# Patient Record
Sex: Female | Born: 1935 | Race: Black or African American | Hispanic: No | Marital: Married | Smoking: Never smoker
Health system: Southern US, Community
[De-identification: ages and names within clinical notes are randomized; demographics above are authoritative.]

## PROBLEM LIST (undated history)

## (undated) DIAGNOSIS — I350 Nonrheumatic aortic (valve) stenosis: Secondary | ICD-10-CM

## (undated) DIAGNOSIS — J449 Chronic obstructive pulmonary disease, unspecified: Secondary | ICD-10-CM

## (undated) DIAGNOSIS — F0390 Unspecified dementia without behavioral disturbance: Secondary | ICD-10-CM

## (undated) DIAGNOSIS — I509 Heart failure, unspecified: Secondary | ICD-10-CM

## (undated) DIAGNOSIS — E079 Disorder of thyroid, unspecified: Secondary | ICD-10-CM

## (undated) DIAGNOSIS — E7800 Pure hypercholesterolemia, unspecified: Secondary | ICD-10-CM

## (undated) DIAGNOSIS — I10 Essential (primary) hypertension: Secondary | ICD-10-CM

## (undated) HISTORY — DX: Nonrheumatic aortic (valve) stenosis: I35.0

## (undated) HISTORY — PX: THYROID SURGERY: SHX805

## (undated) HISTORY — DX: Essential (primary) hypertension: I10

## (undated) HISTORY — DX: Unspecified dementia, unspecified severity, without behavioral disturbance, psychotic disturbance, mood disturbance, and anxiety: F03.90

## (undated) HISTORY — DX: Heart failure, unspecified: I50.9

## (undated) HISTORY — PX: APPENDECTOMY: SHX54

## (undated) HISTORY — PX: JOINT REPLACEMENT: SHX530

---

## 2007-09-12 ENCOUNTER — Ambulatory Visit: Payer: Self-pay | Admitting: Physician Assistant

## 2009-02-13 ENCOUNTER — Ambulatory Visit: Payer: Self-pay | Admitting: Unknown Physician Specialty

## 2009-02-15 ENCOUNTER — Ambulatory Visit: Payer: Self-pay | Admitting: Unknown Physician Specialty

## 2010-04-21 ENCOUNTER — Encounter: Payer: Self-pay | Admitting: Internal Medicine

## 2010-04-24 ENCOUNTER — Ambulatory Visit: Payer: Self-pay | Admitting: Internal Medicine

## 2010-04-30 ENCOUNTER — Ambulatory Visit: Payer: Self-pay | Admitting: Obstetrics and Gynecology

## 2010-05-08 ENCOUNTER — Encounter: Payer: Self-pay | Admitting: Internal Medicine

## 2010-06-08 ENCOUNTER — Encounter: Payer: Self-pay | Admitting: Internal Medicine

## 2010-07-09 ENCOUNTER — Encounter: Payer: Self-pay | Admitting: Internal Medicine

## 2011-04-03 ENCOUNTER — Ambulatory Visit: Payer: Self-pay | Admitting: Internal Medicine

## 2011-08-24 ENCOUNTER — Inpatient Hospital Stay: Payer: Self-pay | Admitting: Internal Medicine

## 2011-08-24 LAB — COMPREHENSIVE METABOLIC PANEL
Albumin: 4 g/dL (ref 3.4–5.0)
Alkaline Phosphatase: 72 U/L (ref 50–136)
Anion Gap: 9 (ref 7–16)
BUN: 14 mg/dL (ref 7–18)
Calcium, Total: 8.6 mg/dL (ref 8.5–10.1)
Co2: 29 mmol/L (ref 21–32)
Creatinine: 0.71 mg/dL (ref 0.60–1.30)
EGFR (Non-African Amer.): >60
Glucose: 98 mg/dL (ref 65–99)
Osmolality: 280 (ref 275–301)
SGOT(AST): 21 U/L (ref 15–37)
SGPT (ALT): 16 U/L
Sodium: 140 mmol/L (ref 136–145)
Total Protein: 7.3 g/dL (ref 6.4–8.2)

## 2011-08-24 LAB — TROPONIN I: Troponin-I: 0.07 ng/mL — ABNORMAL HIGH

## 2011-08-24 LAB — CBC
HGB: 12.7 g/dL (ref 12.0–16.0)
MCH: 32.5 pg (ref 26.0–34.0)
MCHC: 33.3 g/dL (ref 32.0–36.0)
MCV: 97 fL (ref 80–100)
Platelet: 203 10*3/uL (ref 150–440)
RBC: 3.92 10*6/uL (ref 3.80–5.20)

## 2011-08-25 LAB — BASIC METABOLIC PANEL
BUN: 15 mg/dL (ref 7–18)
Creatinine: 0.75 mg/dL (ref 0.60–1.30)
EGFR (Non-African Amer.): >60
Glucose: 142 mg/dL — ABNORMAL HIGH (ref 65–99)
Potassium: 4.3 mmol/L (ref 3.5–5.1)
Sodium: 140 mmol/L (ref 136–145)

## 2011-08-25 LAB — CBC WITH DIFFERENTIAL/PLATELET
Basophil #: 0 10*3/uL (ref 0.0–0.1)
Basophil %: 0.1 %
HCT: 37.1 % (ref 35.0–47.0)
HGB: 12.4 g/dL (ref 12.0–16.0)
Lymphocyte #: 0.7 10*3/uL — ABNORMAL LOW (ref 1.0–3.6)
Lymphocyte %: 17.7 %
MCHC: 33.4 g/dL (ref 32.0–36.0)
MCV: 98 fL (ref 80–100)
Monocyte %: 0.3 %
Neutrophil #: 3.2 10*3/uL (ref 1.4–6.5)
RDW: 14.5 % (ref 11.5–14.5)
WBC: 3.9 10*3/uL (ref 3.6–11.0)

## 2011-08-25 LAB — LIPID PANEL
Cholesterol: 177 mg/dL (ref 0–200)
HDL Cholesterol: 88 mg/dL — ABNORMAL HIGH (ref 40–60)
Ldl Cholesterol, Calc: 83 mg/dL (ref 0–100)
Triglycerides: 31 mg/dL (ref 0–200)

## 2011-08-25 LAB — TROPONIN I: Troponin-I: 0.05 ng/mL

## 2013-08-10 ENCOUNTER — Observation Stay: Payer: Self-pay | Admitting: Internal Medicine

## 2013-08-10 LAB — BASIC METABOLIC PANEL
ANION GAP: 5 — AB (ref 7–16)
BUN: 17 mg/dL (ref 7–18)
CALCIUM: 8.7 mg/dL (ref 8.5–10.1)
Chloride: 104 mmol/L (ref 98–107)
Co2: 28 mmol/L (ref 21–32)
Creatinine: 0.89 mg/dL (ref 0.60–1.30)
EGFR (Non-African Amer.): 57 — ABNORMAL LOW
Glucose: 125 mg/dL — ABNORMAL HIGH (ref 65–99)
Osmolality: 277 (ref 275–301)
POTASSIUM: 3.8 mmol/L (ref 3.5–5.1)
Sodium: 137 mmol/L (ref 136–145)

## 2013-08-10 LAB — URINALYSIS, COMPLETE
Bilirubin,UR: NEGATIVE
Glucose,UR: NEGATIVE mg/dL (ref 0–75)
Ketone: NEGATIVE
Nitrite: NEGATIVE
PROTEIN: NEGATIVE
Ph: 5 (ref 4.5–8.0)
Specific Gravity: 1.016 (ref 1.003–1.030)

## 2013-08-10 LAB — HEPATIC FUNCTION PANEL A (ARMC)
ALK PHOS: 86 U/L
AST: 12 U/L — AB (ref 15–37)
Albumin: 3.6 g/dL (ref 3.4–5.0)
Bilirubin, Direct: 0.2 mg/dL (ref 0.00–0.20)
Bilirubin,Total: 0.6 mg/dL (ref 0.2–1.0)
SGPT (ALT): 13 U/L (ref 12–78)
Total Protein: 7.4 g/dL (ref 6.4–8.2)

## 2013-08-10 LAB — CBC
HCT: 37.4 % (ref 35.0–47.0)
HGB: 12.1 g/dL (ref 12.0–16.0)
MCH: 31.3 pg (ref 26.0–34.0)
MCHC: 32.4 g/dL (ref 32.0–36.0)
MCV: 97 fL (ref 80–100)
Platelet: 253 10*3/uL (ref 150–440)
RBC: 3.87 10*6/uL (ref 3.80–5.20)
RDW: 14 % (ref 11.5–14.5)
WBC: 5.2 10*3/uL (ref 3.6–11.0)

## 2013-08-10 LAB — PROTIME-INR
INR: 1
Prothrombin Time: 13.4 secs (ref 11.5–14.7)

## 2013-08-10 LAB — PRO B NATRIURETIC PEPTIDE: B-Type Natriuretic Peptide: 510 pg/mL — ABNORMAL HIGH (ref 0–450)

## 2013-08-10 LAB — APTT: Activated PTT: 34 secs (ref 23.6–35.9)

## 2013-08-10 LAB — MAGNESIUM: Magnesium: 2 mg/dL

## 2013-08-10 LAB — TROPONIN I: Troponin-I: 0.04 ng/mL

## 2013-08-11 LAB — CBC WITH DIFFERENTIAL/PLATELET
BASOS PCT: 0.5 %
Basophil #: 0 10*3/uL (ref 0.0–0.1)
EOS PCT: 2.9 %
Eosinophil #: 0.1 10*3/uL (ref 0.0–0.7)
HCT: 29.2 % — AB (ref 35.0–47.0)
HGB: 9.9 g/dL — ABNORMAL LOW (ref 12.0–16.0)
LYMPHS ABS: 1.1 10*3/uL (ref 1.0–3.6)
Lymphocyte %: 25.1 %
MCH: 32.8 pg (ref 26.0–34.0)
MCHC: 34 g/dL (ref 32.0–36.0)
MCV: 96 fL (ref 80–100)
Monocyte #: 0.4 x10 3/mm (ref 0.2–0.9)
Monocyte %: 10 %
Neutrophil #: 2.6 10*3/uL (ref 1.4–6.5)
Neutrophil %: 61.5 %
Platelet: 196 10*3/uL (ref 150–440)
RBC: 3.02 10*6/uL — ABNORMAL LOW (ref 3.80–5.20)
RDW: 13.7 % (ref 11.5–14.5)
WBC: 4.2 10*3/uL (ref 3.6–11.0)

## 2013-08-11 LAB — IRON AND TIBC
Iron Bind.Cap.(Total): 226 ug/dL — ABNORMAL LOW (ref 250–450)
Iron Saturation: 23 %
Iron: 52 ug/dL (ref 50–170)
UNBOUND IRON-BIND. CAP.: 174 ug/dL

## 2013-08-11 LAB — HEMOGLOBIN
HGB: 10.2 g/dL — AB (ref 12.0–16.0)
HGB: 9.4 g/dL — ABNORMAL LOW (ref 12.0–16.0)

## 2013-08-11 LAB — FERRITIN: FERRITIN (ARMC): 49 ng/mL (ref 8–388)

## 2013-08-12 LAB — HEMOGLOBIN: HGB: 10.6 g/dL — ABNORMAL LOW (ref 12.0–16.0)

## 2013-08-13 LAB — HEMOGLOBIN
HGB: 8.4 g/dL — ABNORMAL LOW (ref 12.0–16.0)
HGB: 9.6 g/dL — ABNORMAL LOW (ref 12.0–16.0)

## 2013-08-14 LAB — URINE CULTURE

## 2013-08-24 DIAGNOSIS — E785 Hyperlipidemia, unspecified: Secondary | ICD-10-CM | POA: Diagnosis present

## 2013-08-24 DIAGNOSIS — E039 Hypothyroidism, unspecified: Secondary | ICD-10-CM | POA: Diagnosis present

## 2014-02-21 ENCOUNTER — Ambulatory Visit: Payer: Self-pay | Admitting: Ophthalmology

## 2014-02-21 DIAGNOSIS — I10 Essential (primary) hypertension: Secondary | ICD-10-CM

## 2014-02-21 DIAGNOSIS — Z01810 Encounter for preprocedural cardiovascular examination: Secondary | ICD-10-CM

## 2014-02-21 LAB — HEMOGLOBIN: HGB: 12.7 g/dL (ref 12.0–16.0)

## 2014-02-21 LAB — POTASSIUM: POTASSIUM: 4.2 mmol/L (ref 3.5–5.1)

## 2014-03-05 ENCOUNTER — Ambulatory Visit: Payer: Self-pay | Admitting: Ophthalmology

## 2014-05-07 ENCOUNTER — Ambulatory Visit: Payer: Self-pay | Admitting: Ophthalmology

## 2014-07-29 ENCOUNTER — Emergency Department: Payer: Self-pay | Admitting: Emergency Medicine

## 2014-09-29 NOTE — Consult Note (Signed)
I have seen and examined Ann [name] and agree with [name]'s a/p.  rectal bleeding has stopped and her Hgb is stable. Likely diverticular.  monitor Hgbif evidence of active bleeding or large drop in Hgb obtain tagged rbc scan. if hgb stable tomorrow and no further bleeding, ok for d/c.   Electronic Signatures: Dow Adolphein, Orlen Leedy (MD)  (Signed on 06-Mar-15 17:55)  Authored  Last Updated: 06-Mar-15 17:55 by Dow Adolphein, Chase Knebel (MD)

## 2014-09-29 NOTE — Discharge Summary (Signed)
PATIENT NAME:  Ann [name], Ann [name] MR#:  [id number] DATE OF BIRTH:  1919/06/28  DATE OF ADMISSION:  08/10/2013 DATE OF DISCHARGE:  08/14/2013  ADMITTING DIAGNOSIS: Gastrointestinal bleed.   DISCHARGE DIAGNOSES:   1.  Lower gastrointestinal bleed, suspected hemorrhoidal.  2.  Acute posthemorrhagic anemia.  3.  Atrial fibrillation, rapid ventricular response. 4.  History of hypertension, hyperlipidemia, hypothyroidism, osteoarthritis, generalized weakness, unsteadiness on the feet.  5.  Neuropathy of unclear etiology at this time.   DISCHARGE CONDITION: Stable.   DISCHARGE MEDICATIONS: The patient is to continue magnesium oxide 400 milligrams by mouth daily, levothyroxine 50 micrograms by mouth daily, potassium gluconate 1 tablet daily, Symbicort 160/4.5 one puff twice daily, vitamin B12 one tablet once daily, Ventolin HFA 1 puff twice daily alternating with Symbicort, Systane ophthalmic solution 1 drop to each eye once daily, FiberCon 240 milligrams by mouth twice daily, MiraLax 17 grams once daily as needed, diltiazem extended release 120 milligrams by mouth once daily, Lyrica new medication 50 milligrams by mouth at bedtime. The patient was advised to stop aspirin at this time and follow up was recommended by primary care physician. Home oxygen: None.   DIET: Two grams salt, low fat, low cholesterol.   ACTIVITY LIMITATIONS: As tolerated.   FOLLOWUP APPOINTMENT: With Dr. Arlana Pouchate in 2 days after discharge. The patient is referred to home health physical therapy.  The patient is to follow up also with Dr. Shelle Ironein of gastroenterology in 1 to 2 weeks after discharge.   CONSULTANTS:  Dow AdolphMatthew Rein, MD  RADIOLOGIC STUDIES: Chest x-ray portable single view August 10, 2013, showed cardiomegaly without failure.  A GI blood loss study August 12, 2013, was negative.   HOSPITAL COURSE: The patient is a 81 year old African American female with past medical history significant for history of multiple medical problems  including hypertension, hyperlipidemia and hypothyroidism who presents to the hospital with complaints of bright red blood per rectum as well as some chest pain. Please refer to Dr. Clarita LeberVasireddy's admission on August 10, 2013. On arrival to the hospital, the patient's vital signs were normal with temperature of 98.5, pulse was 70s. The patient's pulse was 57. Blood pressure 157/80, respiration rate was 24 and O2 saturation was 97% on room air.  Physical exam was unremarkable. The patient's EKG done on admission showed sinus rhythm at 72 beats per minute, premature ventricular complexes and premature atrial complexes, but no acute ST-T changes were noted.   LABORATORY DATA:  Glucose level was 125, beta-type natriuretic peptide was 510, otherwise BMP was normal. The patient's liver enzymes were normal. Troponin was 0.04. White blood cell count was normal at 5.2, hemoglobin was 12.1, platelet count 253,000 on arrival to the Emergency Room. Coagulation panel was normal. Urinalysis revealed 3+ leukocyte esterase, 75 red blood cells, 8 white blood cells.  Patient's urine cultures revealed mixed bacterial organisms, results suggestive of contamination. The patient was admitted to the hospital for further evaluation. Consultation with Dr. Shelle Ironein, the gastroenterologist, was obtained. Dr. Shelle Ironein felt that the patient's bleeding possibly was diverticular. He recommended to monitor hemoglobin and if evidence of active bleeding or large drop of hemoglobin, he recommended to obtain packed red blood cell scan. The patient was monitored while she was in the hospital and she intermittently would have some bleeding.  Her aspirin, by the way, was stopped and she required a few days stay in the hospital until her bleeding subsided.  Dr. Shelle Ironein followed patient along.  He recommended to continue monitoring the hemoglobin; however, he  felt that there was no plans for colonoscopy in this 81 year old unless evidence shows significant and severe  bleeding. The patient's bleeding had stopped by August 14, 2013.  Dr. Shelle Iron felt that the patient's bleeding could have been also hemorrhoidal based on having blood only when she passes a brown stool.  He continued to indicate a no colonoscopy approach and recommended to start patient on MiraLax and follow up with him in the GI Clinic in the next week or so. As mentioned above, the patient's bleeding stopped and her diet was advanced to a full and soft diet.  She is to continue to follow a liquid diet to soft diet for the next few days and then resume a regular diet.   In regards to atrial fibrillation, the patient is to continue her outpatient medications including diltiazem.  Patient was noted to have an episode of atrial fibrillation. She was initiated on diltiazem and converted into sinus rhythm.  Patient to continue Cardizem CD. No anticoagulation was recommended for her at this point.  She may benefit from atrial fibrillation reevaluation.  She did not undergo cardiac evaluation this time, so I would suggest to her to be seen by cardiologist and possibly have a stress test in the near future. Unfortunately, we will need to hold her aspirin therapy due to her recent GI bleed.   In regards to hypertension, hyperlipidemia, hypothyroidism and osteoarthritis, the patient is to continue her outpatient medications. While in the hospital, the patient was complaining of some burning sensation in her feet. It was felt that she very likely has an element of neuropathy that could be related to vitamin B12 deficiency; however, vitamin B12 level was not checked. The patient was initiated on low dose of Lyrica at that time. It is recommended to advance Lyrica if needed if she has improvement with her neuropathic pain. The patient was evaluated by a physical therapist while she was in the hospital due to her generalized weakness as well as some unsteadiness on  her feet and home health physical therapy was recommended. The  patient is being discharged in stable condition with above-mentioned medications and followup.    On the day of discharge, the patient's temperature was 98, pulse was 73, respiration rate was 16, blood pressure 118/81, saturation was 95% -96% on room air at rest.   TIME SPENT: Forty minutes.    ____________________________ Katharina Caper, MD rv:mk D: 08/14/2013 19:09:19 ET T: 08/14/2013 21:23:29 ET JOB#: [id number]  cc: Katharina Caper, MD, <Dictator> Jillene Bucks. Arlana Pouch, MD Dow Adolph, MD  Ruta Capece MD ELECTRONICALLY SIGNED 09/06/2013 22:03

## 2014-09-29 NOTE — Consult Note (Signed)
PATIENT NAME:  Ann [name], Ann [name] MR#:  [id number] DATE OF BIRTH:  [birth year]  GASTROINTESTINAL CONSULTATION   DATE OF CONSULTATION:  08/11/2013  REFERRING PHYSICIAN:  Susa Griffins, MD CONSULTING PHYSICIAN:  Hardie Shackleton. Colin Benton, PA-C  ATTENDING GASTROENTEROLOGIST: Dow Adolph, MD  REASON FOR CONSULTATION: Bright red blood per rectum.   HISTORY OF PRESENT ILLNESS: This is a pleasant 81 year old female who initially presented to the Emergency Department with concerns of bright red blood per rectum. The patient has noticed blood when using the restroom on a periodic basis over the past several years, and this is often a bright red color on the surface of the stool and on the toilet paper. Most recently when she went to use the bathroom, she noticed quite a bit more blood and thought as though the blood may have been mixed into the consistency of the stool. The color was still described as bright red. There has been no accompanying abdominal pain or rectal pain. No lightheadedness or dizziness. She was having some mild chest pains just prior to ER presentation, but this has entirely resolved, and cardiac workup thus far has been negative. The patient denies any prior history of undergoing a colonoscopy. She is denying any nausea, vomiting, dysphagia or indigestion. Hemoglobin in the ER was 12.5, but since being admitted, this has trending down to 9.9 with an MCV of 96. She has had about 4 to 5 bowel movements throughout the night since being admitted that have continued to be bloody. Earlier this morning, most recently when she had a bowel movement, it was actually normal stool, no evidence of blood and seemed to be a normal brown color. The patient has been n.p.o. and is complaining of an appetite. She is also mentioning a headache that she acquired this morning, which she suspects is secondary to not eating and skipping her usual morning coffee. No known family history of GI malignancy or colon polyps that  she is aware of.   PAST MEDICAL HISTORY: Osteoarthritis, dyslipidemia, hypertension, hypothyroidism and history of tobacco use, currently using inhalers.   PAST SURGICAL HISTORY: Thyroidectomy, appendectomy, carpal tunnel release and bilateral joint replacement surgery.   ALLERGIES: PENICILLIN.   HOME MEDICATIONS: Magnesium oxide, Symbicort, Ventolin, vitamin B12, Synthroid, baby aspirin and potassium.   SOCIAL HISTORY: The patient has a remote history of tobacco use, but denies any current alcohol, tobacco or illicit drug use. She lives alone at home.   FAMILY HISTORY: There is no known family history of GI malignancy, colon polyps or IBD.   REVIEW OF SYSTEMS: A 10-system review of systems was obtained on the patient. Pertinent positives are mentioned above and otherwise negative.   PHYSICAL EXAMINATION:  VITAL SIGNS: Blood pressure 131/81, heart rate is 81, respirations 17, temperature 98 degrees, bedside pulse oximetry is 94%.  GENERAL: This is a pleasant 81 year old female resting quietly and comfortably in bed, in no acute distress. Alert and oriented x3.  HEAD: Atraumatic, normocephalic.  NECK: Supple. No lymphadenopathy noted.  HEENT: Sclerae anicteric. Mucous membranes moist.  PULMONARY: Respirations are even and unlabored. Clear to auscultation in bilateral anterior lung fields.  CARDIAC: Regular rate and rhythm. S1, S2 noted.  ABDOMEN: Soft, nontender, nondistended. Normoactive bowel sounds are noted in all 4 quadrants. No guarding or rebound. No masses, hernias or organomegaly appreciated.  RECTAL: Deferred.  PSYCHIATRIC: Appropriate mood and affect.  EXTREMITIES: Negative for lower extremity edema, 2+ pulses noted bilateral upper extremities.   LABORATORY DATA: Hemoglobin is currently 9.9, down from 12.5 at  admission, hematocrit 29.2, white blood cells 4.2, platelets 196. Sodium 137, potassium 3.8, BUN 17, creatinine 0.89, glucose 125, BNP 510, magnesium 2.0. INR 1.0, PT  13.4. LFTs are within normal limits. MCV is 96. Troponins have been negative.   IMAGING: Chest x-ray was obtained on the patient and was negative for an acute cardiopulmonary process.   ASSESSMENT:  1. Bright red blood per rectum. The patient has noticed intermittent rectal bleeding described as bright red blood on the surface of the stool and on the toilet paper for the past several years. Most recent episode, however, was described as being quite a bit more blood than she is used to. She also suspected there was some blood mixed into the consistency of the stool.  2. Anemia, normocytic. This has declined a few points since being admitted.  3. Headache, this just started this morning. I suspect that this may be a combination of her being n.p.o. and skipping her usual morning coffee.   PLAN: I have discussed this patient's case in detail with Dr. Dow AdolphMatthew Rein, who is involved in the development of the patient's plan of care. The overall clinical picture is suggestive of a lower GI bleed, and certainly the differential diagnosis could include internal hemorrhoids, diverticular bleed, AVMs versus less likely malignancy or neoplasm. She has never had a colonoscopy, but at the advanced age of 81, certainly risks start to outweigh the benefits in undergoing colonoscopy exam at this point. The patient's vital signs are stable, and her most recent bowel movement was without any evidence of blood. Therefore, we do recommend checking serial hemoglobins and keeping a close eye on her for any sort of return of bleeding or decline. Certainly, if there are any signs of active bleeding, we recommend ordering a STAT tagged red blood cell scan to try and identify the source. We will continue to monitor this patient closely and make further recommendations pending above and per clinical course. We will continue to follow. All questions were answered.   Thank you so much for this consultation and for allowing us to  participate in the patient's plan of care.   ATTENDING GASTROENTEROLOGIST: Dow AdolphMatthew Rein, MD   ____________________________ Hardie ShackletonKaryn M. Annslee Tercero, PA-C kme:lb D: 08/11/2013 13:31:17 ET T: 08/11/2013 13:58:05 ET JOB#: [id number]  cc: Hardie ShackletonKaryn M. Kadesia Robel, PA-C, <Dictator> Hardie ShackletonKARYN M Marcelino Campos PA ELECTRONICALLY SIGNED 08/11/2013 14:53

## 2014-09-29 NOTE — Consult Note (Signed)
PATIENT NAME:  Ann [name], Ann [name] MR#:  [id number] DATE OF BIRTH:  25-May-1920  DATE OF CONSULTATION:  08/12/2013  REFERRING PHYSICIAN:   CONSULTING PHYSICIAN:  [name], MD  NO DICTATION   ____________________________ [name], MD mr:ea D: 08/12/2013 21:46:56 ET T: 08/13/2013 05:44:50 ET JOB#: [id number]  cc: [name], MD, <Dictator> [name] ELECTRONICALLY SIGNED 08/31/2013 9:34

## 2014-09-29 NOTE — Op Note (Signed)
PATIENT NAME:  Ann ChapelWALKER, Valeta MR#:  [id number] DATE OF BIRTH:  [birth year]  DATE OF PROCEDURE:  05/07/2014  PREOPERATIVE DIAGNOSIS:  Cataract, left eye.    POSTOPERATIVE DIAGNOSIS:  Cataract, left eye.  PROCEDURE PERFORMED:  Extracapsular cataract extraction using phacoemulsification with placement of an Alcon SN6CWS, 20.5-diopter posterior chamber lens, serial [id number].  SURGEON:  Maylon PeppersSteven A. Alicen Donalson, MD  ASSISTANT:  None.  ANESTHESIA:  4% lidocaine and 0.75% Marcaine in a 50/50 mixture with 10 units/mL of Hylenex added, given as a peribulbar.   ANESTHESIOLOGIST:  Randall AnGjibertus Van Staveren, MD  COMPLICATIONS:  None.  ESTIMATED BLOOD LOSS:  Less than 1 ml.  DESCRIPTION OF PROCEDURE:  The patient was brought to the operating room and given a peribulbar block.  The patient was then prepped and draped in the usual fashion.  The vertical rectus muscles were imbricated using 5-0 silk sutures.  These sutures were then clamped to the sterile drapes as bridle sutures.  A limbal peritomy was performed extending two clock hours and hemostasis was obtained with cautery.  A partial thickness scleral groove was made at the surgical limbus and dissected anteriorly in a lamellar dissection using an Alcon crescent knife.  The anterior chamber was entered supero-temporally with a Superblade and through the lamellar dissection with a 2.6 mm keratome.  DisCoVisc was used to replace the aqueous and a continuous tear capsulorrhexis was carried out.  Hydrodissection and hydrodelineation were carried out with balanced salt and a 27 gauge canula.  The nucleus was rotated to confirm the effectiveness of the hydrodissection.  Phacoemulsification was carried out using a divide-and-conquer technique.  Total ultrasound time was 51 seconds with an average power of 20.6 percent and CDE of 18.41.  Irrigation/aspiration was used to remove the residual cortex.  DisCoVisc was used to inflate the capsule and the internal  incision was enlarged to 3 mm with the crescent knife.  The intraocular lens was folded and inserted into the capsular bag using the AcrySert delivery system.  Irrigation/aspiration was used to remove the residual DisCoVisc.  Miostat was injected into the anterior chamber through the paracentesis track to inflate the anterior chamber and induce miosis. A tenth of a milliliter of Vigamox containing 0.1 mg of drug was injected via the paracentesis tract. The wound was checked for leaks and none were found. The conjunctiva was closed with cautery and the bridle sutures were removed.  Two drops of 0.3% Vigamox were placed on the eye.   An eye shield was placed on the eye.  The patient was discharged to the recovery room in good condition. ____________________________ Maylon PeppersSteven A. Enrique Weiss, MD sad:sb D: 05/07/2014 13:13:50 ET T: 05/07/2014 14:42:20 ET JOB#: [id number]  cc: Viviann SpareSteven A. Jeshawn Melucci, MD, <Dictator> Erline LevineSTEVEN A Cinnamon Morency MD ELECTRONICALLY SIGNED 05/14/2014 13:45

## 2014-09-29 NOTE — H&P (Signed)
PATIENT NAME:  Ann [name], Ann [name] MR#:  [id number] DATE OF BIRTH:  05-Jul-1919  DATE OF ADMISSION:  08/10/2013  PRIMARY CARE PHYSICIAN:  Dr. Dewaine Oatsenny Tate.   REFERRING PHYSICIAN:  Dr. Lenard LancePaduchowski.   CHIEF COMPLAINT:  Bright red blood per rectum, chest pain.   HISTORY OF PRESENT ILLNESS:  Ann [name] is a 81 year old female with history of hypertension, osteoarthritis, experiencing some chest pain, which was vague in her symptoms.  However, this was resolved by taking a Tylenol.  Did not have any associated symptoms.  After some time the patient felt urge to go to the toilet.  The patient had a bloody bowel movement, her stool mixed with blood.  The patient states usually gets blood from time to time whenever she is constipated.  Felt this was much more than usual.  Concerning this, came to the Emergency Department.  Work-up in the Emergency Department, initial hemoglobin was 12.5.  As mentioned above, the patient never had a colonoscopy.  Chest x-ray, no acute cardiopulmonary disease.  EKG is unremarkable.  One set of cardiac enzymes are negative.   PAST MEDICAL HISTORY: 1.  Hypertension.  2.  Hyperlipidemia.  3.  Hypothyroidism.  4.  Osteoarthritis.   PAST SURGICAL HISTORY:  1.  Bilateral joint replacements.  2.  Thyroidectomy.  3.  Appendectomy.  4.  Carpal tunnel.   ALLERGIES:  PENICILLIN.   HOME MEDICATIONS:  1.  Vitamin B12 1 tablet once a day.  2.  Ventolin 1 puff 2 times a day.  3.  Symbicort 160/4.5 mg 2 times a day.  4.  Potassium chloride 1 tablet once a day.  5.  Magnesium oxide 400 mg once a day.  6.  Synthroid 50 mcg once a day.  7.  Aspirin 81 mg once a day.   SOCIAL HISTORY:  No history of smoking, former smoker.  Denies drinking alcohol or using illicit drugs.  Lives by herself.   FAMILY HISTORY:  Mother died at the age of 79 after a hip fracture.  Father died at the age of 79 from heart disease.   REVIEW OF SYSTEMS:  CONSTITUTIONAL:  Denies any generalized  weakness. EYES:  No change in vision.  EARS, NOSE, THROAT:  No change in hearing.  RESPIRATORY:  No cough, shortness of breath.  CARDIOVASCULAR:  No chest pain, palpations.  GASTROINTESTINAL:  No nausea, vomiting, abdominal pain.  GENITOURINARY:  No dysuria or hematuria.  ENDOCRINE:  No polyuria or polydipsia.  SKIN:  No rash or lesions.  MUSCULOSKELETAL:  No joint pains and aches.  NEUROLOGIC:  No weakness or numbness in any part of the body.   PHYSICAL EXAMINATION: GENERAL:  This is a well-built, well-nourished, age-appropriate female lying down in the bed, not in distress.  VITAL SIGNS:  Temperature 98.5, pulse 57, blood pressure 157/80, respiratory rate of 24, oxygen saturation 97% on room air.  HEENT:  Head normocephalic, atraumatic.  Eyes, no scleral icterus.  Conjunctivae normal.  Pupils equal and react to light.  Extraocular movements are intact.  Mucous membranes moist.  No pharyngeal erythema.  NECK:  Supple.  No lymphadenopathy.  No JVD.  No carotid bruit.  CHEST:  Has no focal tenderness.  LUNGS:  Bilaterally clear to auscultation.  HEART:  S1, S2 regular.  No murmurs are heard.  ABDOMEN:  Bowel sounds plus.  Soft, nontender, nondistended.  No hepatosplenomegaly.  EXTREMITIES:  No pedal edema.  Pulses 2+.  SKIN:  No rash or lesions.  MUSCULOSKELETAL:  Good range of  motion in all the extremities.  NEUROLOGIC:  The patient is alert, oriented to place, person and time.  Cranial nerves II through XII intact.  Motor 5 by 5 in upper and lower extremities.   LABORATORY DATA:  UA, 3+ leukocyte esterase, WBC of 8, epithelial of 6.  Chest x-ray, one view, portable, no acute cardiopulmonary disease, cardiomegaly without failure.   Coag profile is well within normal limits.  BNP 510.  BMP is completely within normal limits.  CBC, WBC of 5.2, hemoglobin 12.1; platelet count of 253.   ASSESSMENT AND PLAN:  Ann [name] is a 81 year old female who comes with bright red blood per rectum.   1.  Bright red blood per rectum.  Differential diagnosis, diverticular arteriovenous malformations.  There is a possibility of colonic mass, polyps.  Admit the patient to the monitored bed.  Continue to monitor CBC.  The patient did not have any episodes after coming to the Emergency Department.  Consult gastroenterology in the morning.  Hemodynamically stable.  2.  Chest pain.  This seems to be quite nonspecific.  Cardiac enzymes one-time is negative.  3.  Hypertension:  Currently well-controlled.  Continue with home medications.  4.  Keep the patient on deep vein thrombosis prophylaxis with sequential compression devices.   TIME SPENT:  45 minutes.   ____________________________ Susa Griffins, MD pv:ea D: 08/10/2013 23:24:41 ET T: 08/11/2013 00:44:59 ET JOB#: [id number]  cc: Susa Griffins, MD, <Dictator> Jillene Bucks. Arlana Pouch, MD Clerance Lav Aries Townley MD ELECTRONICALLY SIGNED 08/24/2013 4:27

## 2014-09-29 NOTE — Op Note (Signed)
PATIENT NAME:  [name], Ann [name] DATE OF BIRTH:  [birth year]  DATE OF PROCEDURE:  03/05/2014  PREOPERATIVE DIAGNOSIS: Cataract, right eye.   POSTOPERATIVE DIAGNOSIS:  Cataract, right eye.  PROCEDURE PERFORMED: Extracapsular cataract extraction using phacoemulsification with placement Alcon [id number], 20.0 diopter posterior chamber lens, serial number [id number]   SURGEON: Viviann SpareSteven A. Jessie Schrieber, MD    ANESTHESIA: 4% lidocaine and 0.75% Marcaine, a 50-50 mixture with 10 units/mL of Hylenex added, given his peribulbar.   ANESTHESIOLOGIST: Berdine AddisonMathai Thomas, MD   COMPLICATIONS: None.   ESTIMATED BLOOD LOSS: Less than 1 mL.    DESCRIPTION OF PROCEDURE:  The patient was brought to the operating room and given a peribulbar block.  The patient was then prepped and draped in the usual fashion.  The vertical rectus muscles were imbricated using 5-0 silk sutures.  These sutures were then clamped to the sterile drapes as bridle sutures.  A limbal peritomy was performed extending two clock hours and hemostasis was obtained with cautery.  A partial thickness scleral groove was made at the surgical limbus and dissected anteriorly in a lamellar dissection using an Alcon crescent knife.  The anterior chamber was entered superonasally with a Superblade and through the lamellar dissection with a 2.6 mm keratome.  DisCoVisc was used to replace the aqueous and a continuous tear capsulorrhexis was carried out.  Hydrodissection and hydrodelineation were carried out with balanced salt and a 27 gauge canula.  The nucleus was rotated to confirm the effectiveness of the hydrodissection.  Phacoemulsification was carried out using a divide-and-conquer technique.  Total ultrasound time was 1 minutes and 11.0 seconds with an average power of 23.3%.  CDE of 27.68.  No suture was placed.  Irrigation/aspiration was used to remove the residual cortex.  DisCoVisc was used to inflate the capsule and the internal incision  was enlarged to 3 mm with the crescent knife.  The intraocular lens was folded and inserted into the capsular bag using AcrySert delivery system.  Irrigation/aspiration was used to remove the residual DisCoVisc.  A 0.1 mL of Vigamox containing 0.1 mg of drug was injected into the anterior chamber through the paracentesis track to inflate the anterior chamber and induce miosis.  The wound was checked for leaks and none were found. The conjunctiva was closed with cautery and the bridle sutures were removed.  Two drops of 0.3% Vigamox were placed on the eye.   An eye shield was placed on the eye.  The patient was discharged to the recovery room in good condition.      ____________________________ [name], MD sad:DT D: 03/05/2014 13:16:48 ET T: 03/05/2014 14:48:35 ET JOB#: [id number]  cc: Viviann SpareSteven A. Tanor Glaspy, MD, <Dictator> [name] MD ELECTRONICALLY SIGNED 03/12/2014 12:12

## 2014-09-30 NOTE — Discharge Summary (Signed)
PATIENT NAME:  Ann [name], Ann [name] MR#:  [id number] DATE OF BIRTH:  07-18-1919  DATE OF ADMISSION:  08/24/2011 DATE OF DISCHARGE:  08/26/2011  PRIMARY CARE PHYSICIAN: Dewaine Oats, MD  FINAL DIAGNOSES:  1. Asthma exacerbation.  2. Malignant hypertension.  3. Elevated troponin, not cardiac.  4. Hyperlipidemia.  5. Hypothyroidism.   FINAL PROGRESS NOTE: Lungs are clear with open mouth breathing. Can consider pulmonary function testing or ENT consult if transmitted wheeze still not improved after steroids and antibiotics.   DISCHARGE MEDICATIONS:  1. The patient sometimes takes Tylenol #3 as needed for pain.  2. Levothyroxine 50 mcg daily.  3. Simvastatin 20 mg daily.  4. Vitamin B12 1000 mcg daily.  5. Calcium and vitamin D 2 tablets twice a day.  6. Symbicort 160/4.5 two puffs twice a day.  7. Magnesium oxide 400 mg daily.  8. Potassium over-the-counter daily.   NEW MEDICATIONS:  1. Amlodipine 5 mg daily.  2. Hydrochlorothiazide 25 mg p.o. daily.  3. Zithromax 250 mg p.o. daily for three more days.  4. Albuterol inhaler 2 puffs every six hours as needed for shortness of breath.  5. Colace 100 mg p.o. twice a day as needed for constipation.  6. Omeprazole 20 mg p.o. daily.  7. Prednisone taper as written on prescription until completed.   ACTIVITY: As tolerated.   REFERRALS: Home physical therapy.  DISCHARGE FOLLOWUP: Follow-up in one week with Dr. Arlana Pouch.   REASON FOR ADMISSION: The patient was admitted 08/24/2011. She came in with shortness of breath.   HISTORY OF PRESENT ILLNESS: This is an 81 year old female who has been having shortness of breath for some time now. She does have a cough, nonproductive, and wheeze, some chest discomfort, penetrating sharp pain in the center of her chest, severe pain lasting only a few minutes at a time. In the Emergency Room, she was found to have a borderline troponin of 0.07 and a very elevated blood pressure of 198/110. She was admitted with an  asthma exacerbation and diffuse wheeze. She has been noncompliant with her Symbicort. She was told about compliance with this medication. She was started on Solu-Medrol 125 mg IV x1 and 40 mg IV every eight hours and given Zithromax. For malignant hypertension, Norvasc was added and hydrochlorothiazide was then added. For elevated troponin, serial cardiac enzymes and telemetry monitoring ordered.   LABS/STUDIES: EKG showed sinus rhythm, premature supraventricular complexes at 70 beats per minute.   Glucose 98, BUN 14, creatinine 0.71, sodium 140, potassium 4.3, chloride 102, CO2 29, calcium 8.6. Liver function tests normal. Troponin borderline at 0.07. White blood cell count 4.5, hemoglobin and hematocrit 12.7 and 38.2, platelet count 203.   Chest x-ray: No acute cardiopulmonary disease.   INR 1.0. Next troponin 0.06 and last troponin normalized at 0.05. Total cholesterol 177, triglycerides 31, HDL 88, LDL 83.  Echocardiogram showed an ejection fraction greater than 55%, normal left ventricular wall thickness, normal left ventricular wall motion, and mild aortic regurgitation.   HOSPITAL COURSE PER PROBLEM LIST:  1. For the patient's asthma exacerbation, the patient was started on IV Solu-Medrol and switched over to prednisone taper upon discharge. She was advised to use her Symbicort. She was also given an albuterol inhaler upon discharge. She was given Zithromax during the hospital stay just in case upper respiratory tract infection component. The interesting thing about her exam when she is open-mouth breathing her lungs are clear. When she closes her mouth or breathes through her nose there is a transmitted  wheeze from the upper airway. If this continues after steroid taper and antibiotic course can consider pulmonary function testing or ENT consult.  2. For her malignant hypertension, blood pressure was very elevated upon admission. Amlodipine and hydrochlorothiazide were given. Blood pressure did  range down at 119/75 to 160/80 and numbers in between. She will follow-up as an outpatient.  3. For elevated cardiac troponin, the next enzymes trended down and it was actually negative. The patient was given aspirin initially, but I believe this is secondary to either malignant hypertension or asthma exacerbation; this is not cardiac.  4. For hyperlipidemia, the patient will continue Zocor.  5. For hypothyroidism, the patient will continue levothyroxine.   TIME SPENT ON DISCHARGE: 35 minutes.  ____________________________ Herschell Dimesichard J. Renae GlossWieting, MD rjw:slb D: 08/26/2011 17:10:45 ET T: 08/27/2011 10:31:22 ET JOB#: [id number]  cc: Herschell Dimesichard J. Renae GlossWieting, MD, <Dictator> Jillene Bucksenny C. Arlana Pouchate, MD Salley ScarletICHARD J Kyley Laurel MD ELECTRONICALLY SIGNED 08/28/2011 18:12

## 2014-09-30 NOTE — H&P (Signed)
PATIENT NAME:  Ann [name], Ann [name] MR#:  [id number] DATE OF BIRTH:  December 05, 1919  DATE OF ADMISSION:  08/24/2011  PRIMARY CARE PHYSICIAN: Dewaine Oatsenny Tate, MD  CHIEF COMPLAINT: Shortness of breath.   HISTORY OF PRESENT ILLNESS: The patient is an 81 year old female who complains of shortness of breath going on for some time now. She does have some cough nonproductive and wheeze. She also complains of some chest discomfort, a penetrating sharp pain in the center of her chest, severe pain when she gets it lasting only some minutes. She did have some left arm pain this morning. The last time she had the chest pain was two nights ago. She states it can happen at any time but mostly when she is in bed turning over, and it only lasts a few minutes. In the Emergency Room, she was found to have a blood pressure of 198/110, and a borderline troponin of 0.07, and hospitalist services were contacted for further evaluation.   PAST MEDICAL HISTORY:  1. Arthritis.  2. Hypertension.  3. Hypothyroidism.  4. Hyperlipidemia.   PAST SURGICAL HISTORY:  1. Bilateral joint replacements. 2. Thyroidectomy.  3. Appendectomy.  4. Carpal tunnel.   ALLERGIES: No known drug allergies.   MEDICATIONS:  1. Synthroid 50 mcg in the a.m.  2. Simvastatin 20 mg at bedtime.  3. Potassium twice a day.  4. Symbicort 160/4.5, 2 puffs twice a day but not taking it that way.  5. Magnesium oxide 400 mg daily.  6. Aspirin occasionally. 7. Tylenol p.r.n.  and Tylenol #3.   SOCIAL HISTORY:  No smoking. Occasional wine. No drug use. She taught elementary school in the past. She lives close to her daughter.   FAMILY HISTORY: Mother died at 3287 after a hip fracture. Father died at 5886 of heart disease.   REVIEW OF SYSTEMS: CONSTITUTIONAL: Positive for sweating at night. Weight fluctuates up and down. No fever or chills. Positive for fatigue. EYES: She does wear glasses and has cataracts. EARS, NOSE, MOUTH, AND THROAT: Right hearing loss, left  hearing aid, positive for runny nose. Positive for sore throat. Positive for dysphagia for liquids going on for a while. CARDIOVASCULAR: Positive for chest pain. No palpitations. RESPIRATORY: Positive for shortness of breath. Positive for cough. Positive for wheeze. GASTROINTESTINAL: Positive for nausea 1 to 2 weeks ago. No abdominal pain. No vomiting. Positive for constipation. GENITOURINARY: Increased urination, positive for nocturia. MUSCULOSKELETAL: Positive for toe pain. INTEGUMENT: No rashes or eruptions. NEUROLOGICAL: No fainting or blackouts. PSYCHIATRIC: Positive for stress with husband. ENDOCRINE: Positive for hypothyroidism. HEMATOLOGIC/LYMPHATIC: No anemia.   PHYSICAL EXAMINATION:  VITAL SIGNS: Temperature 97, pulse 70, respirations 18, blood pressure 191/90, pulse oximetry 97% on room air.   GENERAL: No respiratory distress.   HEENT: Eyes: Conjunctivae and lids normal. Pupils are equal, round, and reactive to light. Extraocular muscles are intact. No nystagmus. Ears, nose, mouth, and throat: Tympanic membranes bulging and erythematous. Nasal mucosa no erythema. Throat no erythema. No exudate seen. Lips and gums no lesions.   NECK: Positive lymphadenopathy. No thyromegaly. No thyroid nodules palpated. No bruits. Carotid upstroke 2+ bilaterally.    RESPIRATORY: Decreased breath sounds bilaterally. Positive wheeze throughout entire lung field. No rhonchi or rales heard.   CARDIOVASCULAR: S1, S2 normal. No gallops, rubs, or murmurs heard. Carotid upstroke 2+ bilaterally. No bruits.  EXTREMITIES: Dorsalis pedis pulses 2+ bilaterally, 2+ edema of the lower extremity.   ABDOMEN: Soft and nontender. No organomegaly/splenomegaly. Normoactive bowel sounds. No masses felt.   LYMPHATIC:  Positive  lymph nodes in the neck.   MUSCULOSKELETAL: 2+ edema. No clubbing. No cyanosis.   SKIN: No rashes or ulcers seen.   NEUROLOGICAL: Cranial nerves II through XII are grossly intact. Deep tendon  reflexes are 2+  bilateral lower extremities.   PSYCHIATRIC: The patient is oriented to person, place, and time.   LABORATORY, DIAGNOSTIC AND RADIOLOGICAL DATA:  EKG: Sinus rhythm, premature supraventricular complexes, no acute ST-T wave changes. Chest x-ray no acute disease in the chest.  White blood cell count 4.5, hemoglobin and hematocrit 12.7 and 38.2, platelet count of 203. Troponin 0.07. Glucose 98, BUN 14, creatinine 0.71, sodium 140, potassium 4.3, chloride 102, CO2 29, calcium 8.6. Liver function tests normal.   ASSESSMENT AND PLAN:  1. Asthma exacerbation with diffuse wheeze: The patient is noncompliant with the Symbicort. We will continue and will give DuoNeb nebulizer solution, IV Solu-Medrol 125 mg IV x1 and 40 mg IV every 8 hours. We will also give Zithromax just in case bronchitis on top of that with upper respiratory infection.  2. Malignant hypertension: The patient is not on blood pressure medications. Low salt diet. We will give a stat dose of Norvasc and also give hydrochlorothiazide and continue to monitor on telemetry. All pulses are equal throughout upper and lower extremities. No signs of dissection.  3. Elevated troponin: We will give aspirin. No beta blocker secondary to diffuse wheezing and bronchospasm. We will monitor her on telemetry, get serial cardiac enzymes. We will hold off on full anticoagulation at this point unless enzymes turn positive. Most likely this is secondary to asthma exacerbation and wheezing and malignant hypertension rather than myocardial infarction.  4. Hyperlipidemia: Check a lipid profile in the a.m. Continue simvastatin.  5. Hypothyroidism: Continue Synthroid.   TIME SPENT ON ADMISSION: 55 minutes.   ____________________________ Herschell Dimes. Renae Gloss, MD rjw:cbb D: 08/24/2011 15:59:56 ET T: 08/24/2011 17:41:36 ET JOB#: [id number]  cc: Herschell Dimes. Renae Gloss, MD, <Dictator> Jillene Bucks. Arlana Pouch, MD Salley Scarlet MD ELECTRONICALLY SIGNED 08/28/2011  17:48

## 2015-03-10 IMAGING — CR DG CHEST 1V PORT
1 series · 1 of 1 positions shown · non-contrast
Comparison: DG CHEST 2V dated 04/03/2011

CLINICAL DATA: Short of breath.  Chest pain.

EXAM:
PORTABLE CHEST - 1 VIEW

[ap]
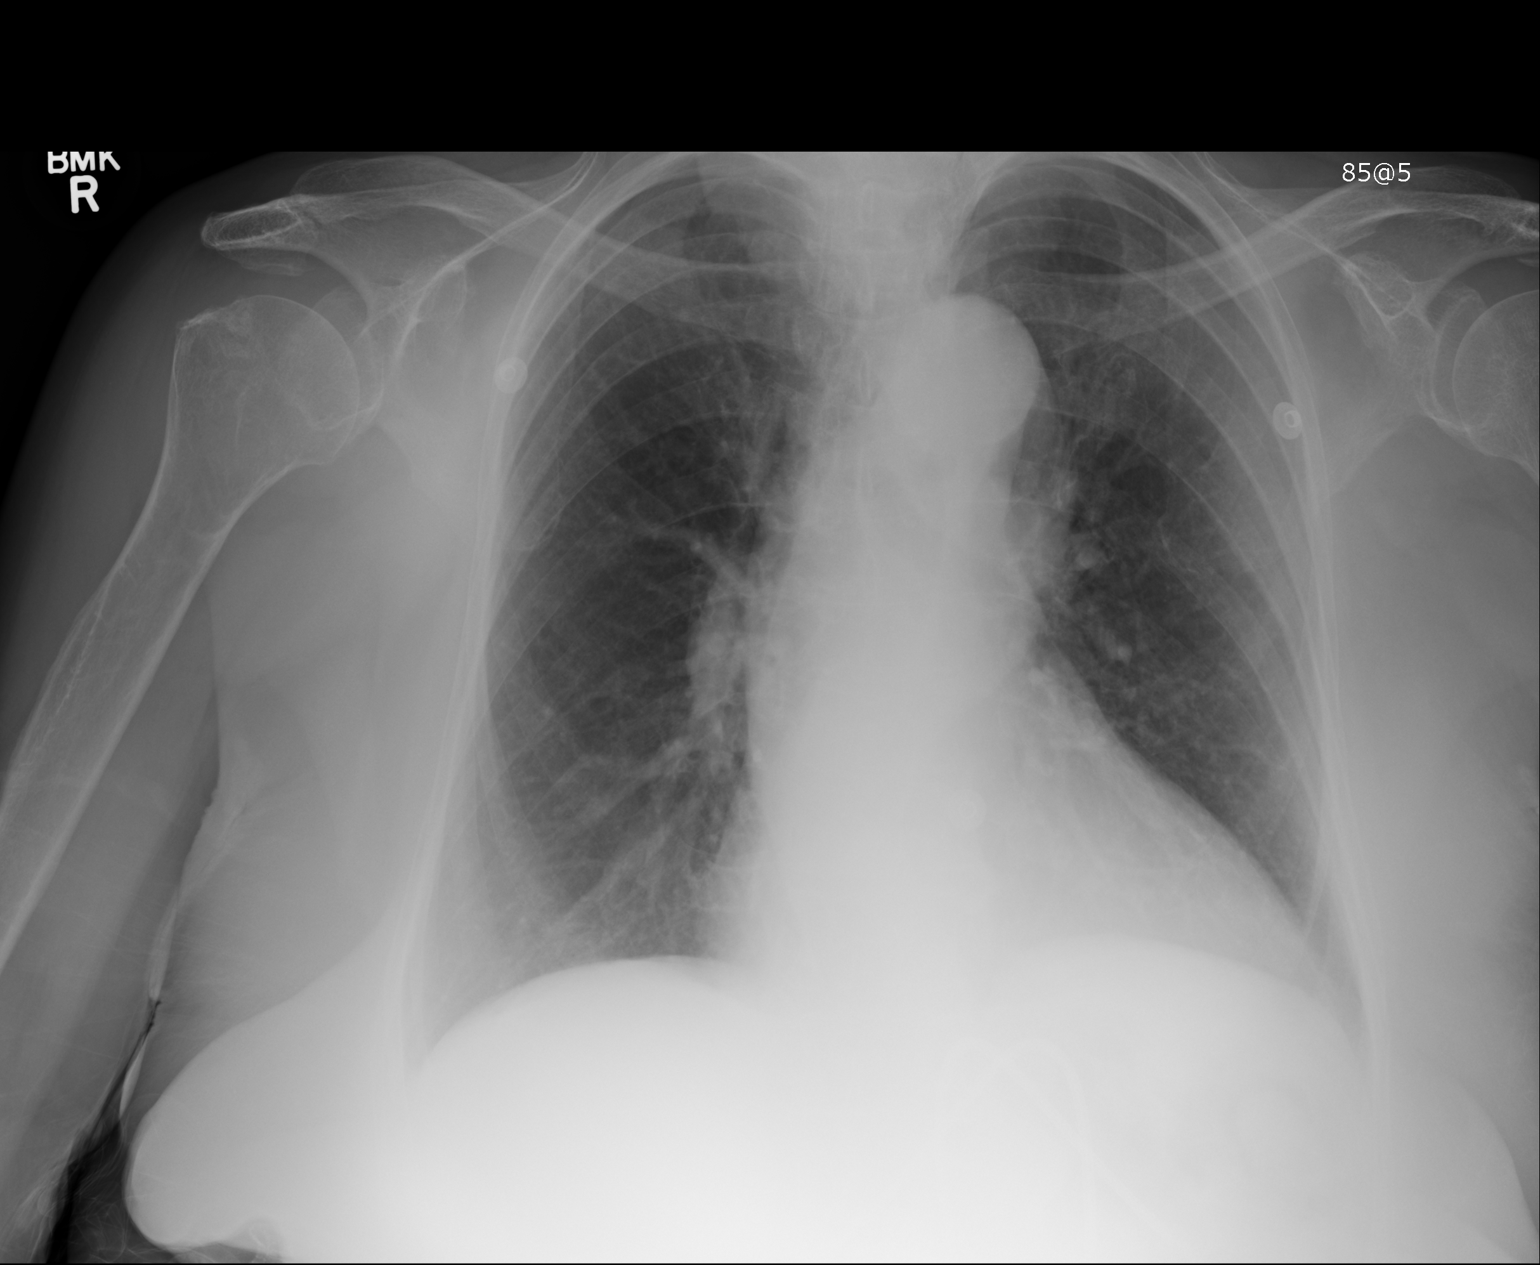

[1 of 1 positions shown; findings below may reference images not displayed]

FINDINGS: Cardiomegaly. Tortuous thoracic aorta. No airspace disease or
pleural effusion. Compared to prior, there is no interval change.
Monitoring buttons project over the chest.
IMPRESSION: Cardiomegaly without failure.

## 2015-03-12 IMAGING — NM NUCLEAR MEDICINE GASTROINTESTINAL BLEEDING STUDY
1 series · 6 of 6 positions shown · non-contrast
Comparison: None.

RADIOPHARMACEUTICALS:  00.SmFi Ic-JJm in-vitro labeled red cells.

CLINICAL DATA: GI bleed.

EXAM:
NUCLEAR MEDICINE GASTROINTESTINAL BLEEDING SCAN
TECHNIQUE: Sequential abdominal images were obtained following intravenous
administration of Ic-JJm labeled red blood cells.

[Series 1000: gi bleed · 4.80mm/px · 6 of 250 frames shown]
[frame 21/250]
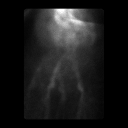
[frame 63/250]
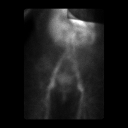
[frame 105/250]
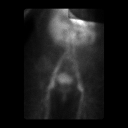
[frame 146/250]
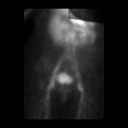
[frame 188/250]
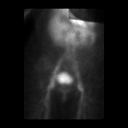
[frame 230/250]
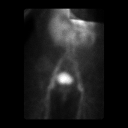

[6 of 6 positions shown; findings below may reference images not displayed]

FINDINGS: There is no abnormal accumulation of radiotracer. Specifically, no
accumulation of radiotracer that progresses distally as would be
expected from a GI bleed. There is no evidence of a GI bleeding
source on the exam.
IMPRESSION: Negative exam.

## 2015-07-15 ENCOUNTER — Emergency Department: Payer: Medicare PPO

## 2015-07-15 ENCOUNTER — Emergency Department
Admission: EM | Admit: 2015-07-15 | Discharge: 2015-07-16 | Disposition: A | Discharge status: Home / Self Care | Payer: Medicare PPO | Attending: Emergency Medicine | Admitting: Emergency Medicine

## 2015-07-15 ENCOUNTER — Encounter: Payer: Self-pay | Admitting: Medical Oncology

## 2015-07-15 DIAGNOSIS — Z880 Allergy status to penicillin: Secondary | ICD-10-CM | POA: Insufficient documentation

## 2015-07-15 DIAGNOSIS — R0600 Dyspnea, unspecified: Secondary | ICD-10-CM

## 2015-07-15 DIAGNOSIS — J449 Chronic obstructive pulmonary disease, unspecified: Secondary | ICD-10-CM

## 2015-07-15 DIAGNOSIS — R079 Chest pain, unspecified: Secondary | ICD-10-CM | POA: Diagnosis present

## 2015-07-15 DIAGNOSIS — J441 Chronic obstructive pulmonary disease with (acute) exacerbation: Secondary | ICD-10-CM | POA: Diagnosis not present

## 2015-07-15 HISTORY — DX: Disorder of thyroid, unspecified: E07.9

## 2015-07-15 HISTORY — DX: Chronic obstructive pulmonary disease, unspecified: J44.9

## 2015-07-15 HISTORY — DX: Pure hypercholesterolemia, unspecified: E78.00

## 2015-07-15 LAB — BASIC METABOLIC PANEL
ANION GAP: 4 — AB (ref 5–15)
BUN: 16 mg/dL (ref 6–20)
CO2: 32 mmol/L (ref 22–32)
Calcium: 9.2 mg/dL (ref 8.9–10.3)
Chloride: 103 mmol/L (ref 101–111)
Creatinine, Ser: 0.73 mg/dL (ref 0.44–1.00)
GFR calc non Af Amer: >60 mL/min (ref >60)
Glucose, Bld: 103 mg/dL — ABNORMAL HIGH (ref 65–99)
POTASSIUM: 3.7 mmol/L (ref 3.5–5.1)
Sodium: 139 mmol/L (ref 135–145)

## 2015-07-15 LAB — CBC
HCT: 39.7 % (ref 35.0–47.0)
Hemoglobin: 13.2 g/dL (ref 12.0–16.0)
MCH: 32.2 pg (ref 26.0–34.0)
MCHC: 33.3 g/dL (ref 32.0–36.0)
MCV: 96.6 fL (ref 80.0–100.0)
Platelets: 205 10*3/uL (ref 150–440)
RBC: 4.1 MIL/uL (ref 3.80–5.20)
RDW: 14.2 % (ref 11.5–14.5)
WBC: 3.8 10*3/uL (ref 3.6–11.0)

## 2015-07-15 LAB — TROPONIN I
Troponin I: 0.04 ng/mL — ABNORMAL HIGH (ref <0.031)
Troponin I: 0.04 ng/mL — ABNORMAL HIGH (ref <0.031)

## 2015-07-15 LAB — BRAIN NATRIURETIC PEPTIDE: B Natriuretic Peptide: 92 pg/mL (ref 0.0–100.0)

## 2015-07-15 MED ORDER — OPTICHAMBER DIAMOND MISC
Status: AC
  Administered 2015-07-15: 23:00:00
  Filled 2015-07-15: qty 1

## 2015-07-15 MED ORDER — IPRATROPIUM-ALBUTEROL 0.5-2.5 (3) MG/3ML IN SOLN
3.0000 mL | Freq: Once | RESPIRATORY_TRACT | Status: AC
  Administered 2015-07-15: 3 mL via RESPIRATORY_TRACT
  Filled 2015-07-15: qty 3

## 2015-07-15 MED ORDER — PREDNISONE 20 MG PO TABS
60.0000 mg | ORAL_TABLET | Freq: Once | ORAL | Status: AC
  Administered 2015-07-16: 60 mg via ORAL
  Filled 2015-07-15: qty 3

## 2015-07-15 MED ORDER — IPRATROPIUM-ALBUTEROL 0.5-2.5 (3) MG/3ML IN SOLN
3.0000 mL | Freq: Once | RESPIRATORY_TRACT | Status: AC
Start: 2015-07-15 — End: 2015-07-15
  Administered 2015-07-15: 3 mL via RESPIRATORY_TRACT
  Filled 2015-07-15: qty 3

## 2015-07-15 MED ORDER — ALBUTEROL SULFATE HFA 108 (90 BASE) MCG/ACT IN AERS: 2.0000 | INHALATION_SPRAY | Freq: Once | RESPIRATORY_TRACT | Status: DC

## 2015-07-15 MED ORDER — PREDNISONE 20 MG PO TABS: 20.0000 mg | ORAL_TABLET | Freq: Every day | ORAL | Status: AC

## 2015-07-15 MED ORDER — ALBUTEROL SULFATE HFA 108 (90 BASE) MCG/ACT IN AERS: 2.0000 | INHALATION_SPRAY | Freq: Four times a day (QID) | RESPIRATORY_TRACT | Status: DC | PRN

## 2015-07-15 NOTE — ED Provider Notes (Signed)
[facility] Emergency Department Provider Note  ____________________________________________  Time seen: Approximately 7:06 PM  I have reviewed the triage vital signs and the nursing notes.   HISTORY  Chief Complaint Shortness of Breath and Chest Pain    HPI Ann [name] is a 82 y.o. female who complains of intermittent wheezing for the last few weeks. She's been wheezing today he feels short of breath and after she got on her errands going to the back in the store and she got back home she had some chest pressure. Lying some chest pressure currently. EKG looks okay. Shortness of breath and chest pressure mild to moderate in nature seems to be worse with exercise.   Past Medical History  Diagnosis Date  . High cholesterol   . Thyroid disease   . COPD (chronic obstructive pulmonary disease) (HCC)     There are no active problems to display for this patient.   Past Surgical History  Procedure Laterality Date  . Appendectomy    . Joint replacement    . Thyroid surgery      Current Outpatient Rx  Name  Route  Sig  Dispense  Refill  . albuterol (PROVENTIL HFA;VENTOLIN HFA) 108 (90 Base) MCG/ACT inhaler   Inhalation   Inhale 2 puffs into the lungs every 6 (six) hours as needed for wheezing or shortness of breath. Dispense 1 spacer to use with the inhaler   1 Inhaler   2   . predniSONE (DELTASONE) 20 MG tablet   Oral   Take 1 tablet (20 mg total) by mouth daily.   5 tablet   0     Allergies Penicillins  No family history on file.  Social History Social History  Substance Use Topics  . Smoking status: Never Smoker   . Smokeless tobacco: None  . Alcohol Use: None    Review of Systems Constitutional: No fever/chills Eyes: No visual changes. ENT: No sore throat. Cardiovascular: See history of present illness Respiratory: See history of present illness. Gastrointestinal: No abdominal pain.  No nausea, no vomiting.  No diarrhea.  No  constipation. Genitourinary: Negative for dysuria. Musculoskeletal: Negative for back pain. Skin: Negative for rash.  10-point ROS otherwise negative.  ____________________________________________   PHYSICAL EXAM:  VITAL SIGNS: ED Triage Vitals  Enc Vitals Group     BP 07/15/15 1650 189/88 mmHg     Pulse Rate 07/15/15 1650 63     Resp 07/15/15 1650 21     Temp 07/15/15 1650 97.6 F (36.4 C)     Temp Source 07/15/15 1650 Oral     SpO2 07/15/15 1650 95 %     Weight 07/15/15 1650 200 lb (90.719 kg)     Height 07/15/15 1650  (1.702 m)     Head Cir --      Peak Flow --      Pain Score 07/15/15 1651 8     Pain Loc --      Pain Edu? --      Excl. in GC? --     Constitutional: Alert and oriented. Well appearing and in no acute distress. Eyes: Conjunctivae are normal. PERRL. EOMI. Head: Atraumatic. Nose: No congestion/rhinnorhea. Mouth/Throat: Mucous membranes are moist.  Oropharynx non-erythematous. Neck: No stridor.  Cardiovascular: Normal rate, regular rhythm. Grossly normal heart sounds.  Good peripheral circulation. Respiratory: Normal respiratory effort.  No retractions. Lungs diffuse wheezes. Gastrointestinal: Soft and nontender. No distentAndochick Surgical Center LLCCVA tenderness. Musculoskeletal: No lower extremity tenderness  plus edema bilaterally.  No joint effusions. Neurologic:  Normal speech and language. No gross focal neurologic deficits are appreciated. No gait instability. Skin:  Skin is warm, dry and intact. No rash noted. Psychiatric: Mood and affect are normal. Speech and behavior are normal.  ____________________________________________   LABS (all labs ordered are listed, but only abnormal results are displayed)  Labs Reviewed  BASIC METABOLIC PANEL - Abnormal; Notable for the following:    Glucose, Bld 103 (*)    Anion gap 4 (*)    All other components within normal limits  TROPONIN I - Abnormal; Notable for the following:    Troponin I  0.04 (*)    All other components within normal limits  TROPONIN I - Abnormal; Notable for the following:    Troponin I 0.04 (*)    All other components within normal limits  CBC  BRAIN NATRIURETIC PEPTIDE   ____________________________________________  EKG  EKG read and interpreted by me shows normal sinus rhythm rate of 77 normal axis. His reading minimal ST depression in inferior leads however I believe that is artifact. ____________________________________________  RADIOLOGY  Chest x-ray read by radiology reviewed by me as no acute disease ____________________________________________   PROCEDURES    ____________________________________________   INITIAL IMPRESSION / ASSESSMENT AND PLAN / ED COURSE  Pertinent labs & imaging results that were available during my care of the patient were reviewed by me and considered in my medical decision making (see chart for details). Patient's wheezes cleared completely and patient feels much better after 2 nebs. ____________________________________________   FINAL CLINICAL IMPRESSION(S) / ED DIAGNOSES  Final diagnoses:  Dyspnea  Chronic obstructive pulmonary disease, unspecified COPD type (HCC)      Arnaldo Natal, MD 07/16/15 [phone]

## 2015-07-15 NOTE — ED Notes (Signed)
Pt reports she has been having sob that has worsened over last week. Pt reports today she noticed some left sided chest pressure with breathing.

## 2015-07-15 NOTE — Progress Notes (Signed)
Instructed patient on Optiflow spacer for home use.

## 2015-07-15 NOTE — ED Notes (Signed)
MD ok with Troponin at this time

## 2015-08-07 DIAGNOSIS — I10 Essential (primary) hypertension: Secondary | ICD-10-CM | POA: Diagnosis present

## 2015-08-15 NOTE — Progress Notes (Signed)
Patient seen and examined She just saw Dr Lady GaryFath 08/07/15 Does not need to see another cardiologist Cancelled appt She thought she was seeing a pulmonary doctor for her asthma Copay returned  Ann HawsPeter Letcher Schweikert

## 2015-08-20 ENCOUNTER — Ambulatory Visit (INDEPENDENT_AMBULATORY_CARE_PROVIDER_SITE_OTHER): Payer: Medicare HMO | Admitting: Cardiovascular Disease

## 2015-08-20 VITALS — Ht 67.0 in | Wt 205.0 lb

## 2015-08-20 DIAGNOSIS — Z7689 Persons encountering health services in other specified circumstances: Secondary | ICD-10-CM

## 2015-08-20 DIAGNOSIS — Z7189 Other specified counseling: Secondary | ICD-10-CM

## 2016-06-23 ENCOUNTER — Ambulatory Visit
Admission: RE | Admit: 2016-06-23 | Discharge: 2016-06-23 | Disposition: A | Discharge status: Home / Self Care | Payer: Medicare HMO | Source: Ambulatory Visit | Attending: Internal Medicine | Admitting: Internal Medicine

## 2016-06-23 ENCOUNTER — Other Ambulatory Visit: Payer: Self-pay | Admitting: Internal Medicine

## 2016-06-23 DIAGNOSIS — R05 Cough: Secondary | ICD-10-CM

## 2016-06-23 DIAGNOSIS — R059 Cough, unspecified: Secondary | ICD-10-CM

## 2017-02-11 IMAGING — CR DG CHEST 2V
1 series · 2 of 2 positions shown · non-contrast
Comparison: 07/29/2014

CLINICAL DATA: Shortness of breath worsening last week. Chest pain.

EXAM:
CHEST  2 VIEW

[Series 1: w chest pa · 0.14mm/px · 2 of 2 slices shown]
[im 1/2]
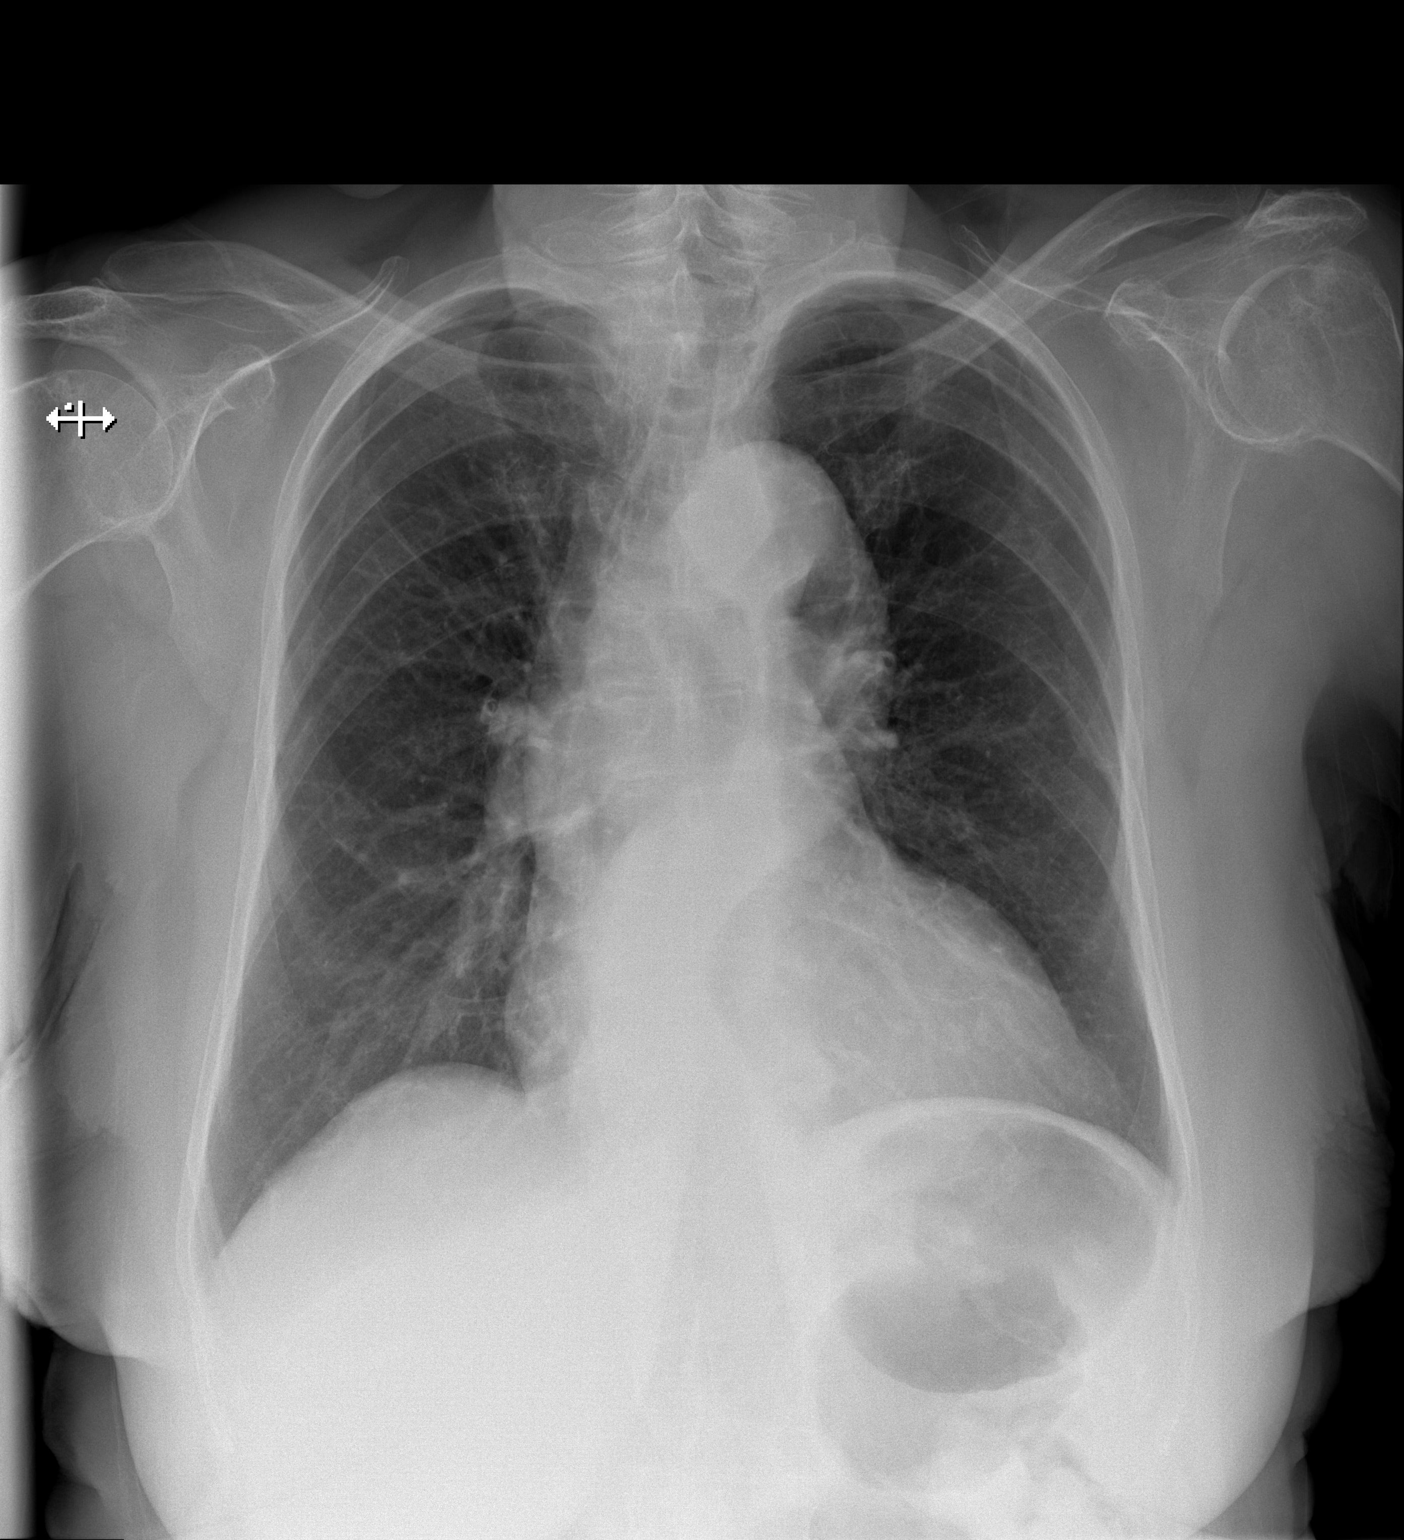
[im 2/2]
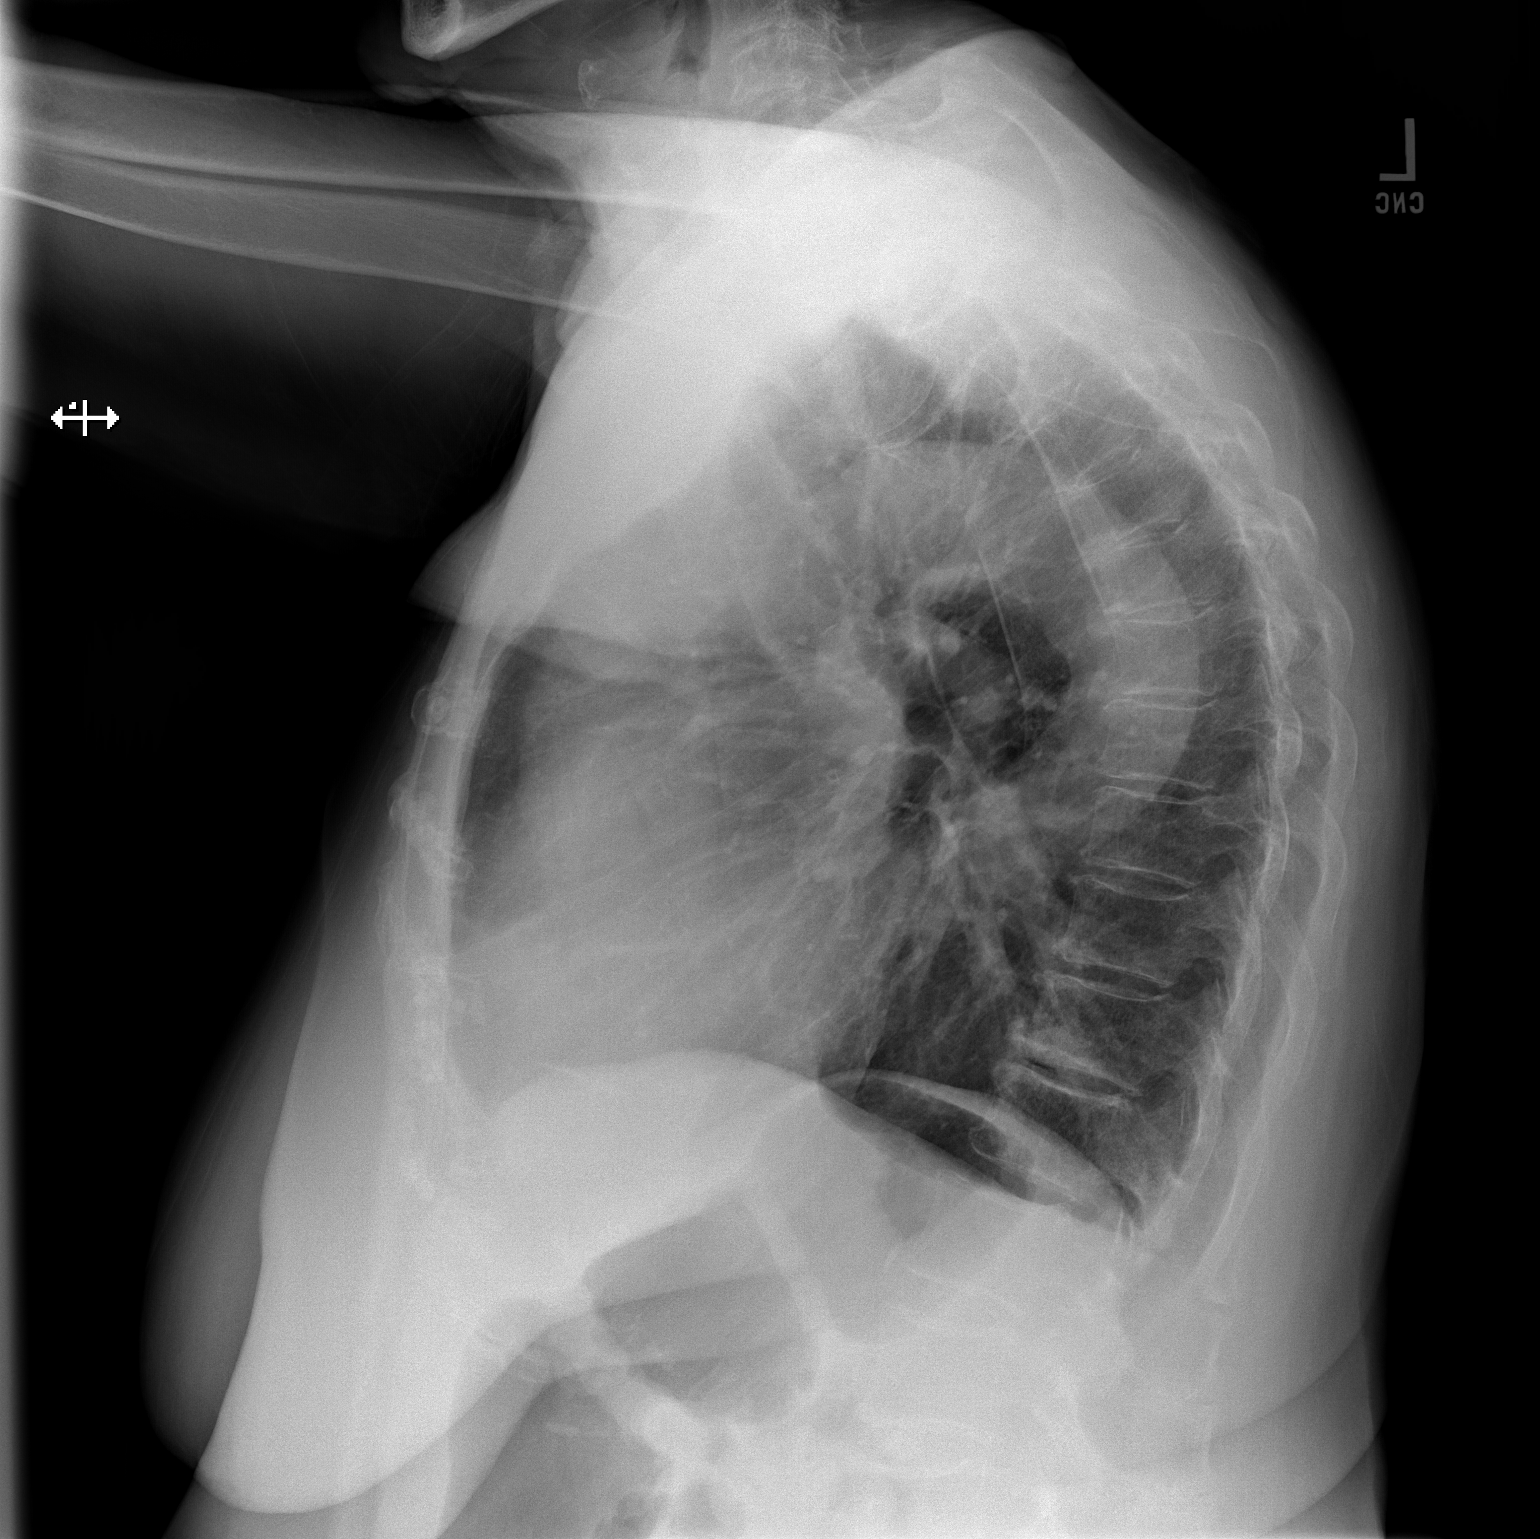

[2 of 2 positions shown; findings below may reference images not displayed]

FINDINGS: There is tortuosity of the thoracic aorta. Heart is normal size.
Lungs are clear. No effusions. No acute bony abnormality.
IMPRESSION: No active cardiopulmonary disease.

## 2017-08-30 DIAGNOSIS — I517 Cardiomegaly: Secondary | ICD-10-CM | POA: Insufficient documentation

## 2017-08-30 DIAGNOSIS — I422 Other hypertrophic cardiomyopathy: Secondary | ICD-10-CM | POA: Insufficient documentation

## 2019-01-30 DIAGNOSIS — I359 Nonrheumatic aortic valve disorder, unspecified: Secondary | ICD-10-CM | POA: Diagnosis present

## 2021-10-11 ENCOUNTER — Other Ambulatory Visit: Payer: Self-pay

## 2021-10-11 ENCOUNTER — Inpatient Hospital Stay
Admission: EM | Admit: 2021-10-11 | Discharge: 2021-10-14 | DRG: 177 | Disposition: A | Discharge status: Home Health | Payer: Medicare HMO | Attending: Internal Medicine | Admitting: Internal Medicine

## 2021-10-11 ENCOUNTER — Emergency Department: Payer: Medicare HMO

## 2021-10-11 DIAGNOSIS — E7800 Pure hypercholesterolemia, unspecified: Secondary | ICD-10-CM | POA: Diagnosis present

## 2021-10-11 DIAGNOSIS — Z880 Allergy status to penicillin: Secondary | ICD-10-CM | POA: Diagnosis not present

## 2021-10-11 DIAGNOSIS — J1282 Pneumonia due to coronavirus disease 2019: Secondary | ICD-10-CM | POA: Diagnosis present

## 2021-10-11 DIAGNOSIS — I248 Other forms of acute ischemic heart disease: Secondary | ICD-10-CM | POA: Diagnosis present

## 2021-10-11 DIAGNOSIS — I499 Cardiac arrhythmia, unspecified: Secondary | ICD-10-CM

## 2021-10-11 DIAGNOSIS — Z79890 Hormone replacement therapy (postmenopausal): Secondary | ICD-10-CM | POA: Diagnosis not present

## 2021-10-11 DIAGNOSIS — I10 Essential (primary) hypertension: Secondary | ICD-10-CM | POA: Diagnosis present

## 2021-10-11 DIAGNOSIS — R778 Other specified abnormalities of plasma proteins: Secondary | ICD-10-CM | POA: Diagnosis not present

## 2021-10-11 DIAGNOSIS — U071 COVID-19: Principal | ICD-10-CM

## 2021-10-11 DIAGNOSIS — E039 Hypothyroidism, unspecified: Secondary | ICD-10-CM | POA: Diagnosis present

## 2021-10-11 DIAGNOSIS — Z791 Long term (current) use of non-steroidal anti-inflammatories (NSAID): Secondary | ICD-10-CM | POA: Diagnosis not present

## 2021-10-11 DIAGNOSIS — J440 Chronic obstructive pulmonary disease with acute lower respiratory infection: Secondary | ICD-10-CM | POA: Diagnosis present

## 2021-10-11 DIAGNOSIS — Z79899 Other long term (current) drug therapy: Secondary | ICD-10-CM

## 2021-10-11 DIAGNOSIS — I509 Heart failure, unspecified: Principal | ICD-10-CM

## 2021-10-11 DIAGNOSIS — F0390 Unspecified dementia without behavioral disturbance: Secondary | ICD-10-CM | POA: Diagnosis present

## 2021-10-11 DIAGNOSIS — I350 Nonrheumatic aortic (valve) stenosis: Secondary | ICD-10-CM | POA: Diagnosis present

## 2021-10-11 DIAGNOSIS — E785 Hyperlipidemia, unspecified: Secondary | ICD-10-CM | POA: Diagnosis present

## 2021-10-11 DIAGNOSIS — R0602 Shortness of breath: Secondary | ICD-10-CM

## 2021-10-11 DIAGNOSIS — I359 Nonrheumatic aortic valve disorder, unspecified: Secondary | ICD-10-CM | POA: Diagnosis present

## 2021-10-11 DIAGNOSIS — R7989 Other specified abnormal findings of blood chemistry: Secondary | ICD-10-CM

## 2021-10-11 DIAGNOSIS — Z7951 Long term (current) use of inhaled steroids: Secondary | ICD-10-CM

## 2021-10-11 LAB — CBC WITH DIFFERENTIAL/PLATELET
Abs Immature Granulocytes: 0.04 10*3/uL (ref 0.00–0.07)
Basophils Absolute: 0 10*3/uL (ref 0.0–0.1)
Basophils Relative: 1 %
Eosinophils Absolute: 0 10*3/uL (ref 0.0–0.5)
Eosinophils Relative: 0 %
HCT: 35.3 % — ABNORMAL LOW (ref 36.0–46.0)
Hemoglobin: 11.1 g/dL — ABNORMAL LOW (ref 12.0–15.0)
Immature Granulocytes: 1 %
Lymphocytes Relative: 15 %
Lymphs Abs: 0.6 10*3/uL — ABNORMAL LOW (ref 0.7–4.0)
MCH: 30.5 pg (ref 26.0–34.0)
MCHC: 31.4 g/dL (ref 30.0–36.0)
MCV: 97 fL (ref 80.0–100.0)
Monocytes Absolute: 0.5 10*3/uL (ref 0.1–1.0)
Monocytes Relative: 13 %
Neutro Abs: 2.9 10*3/uL (ref 1.7–7.7)
Neutrophils Relative %: 70 %
Platelets: 220 10*3/uL (ref 150–400)
RBC: 3.64 MIL/uL — ABNORMAL LOW (ref 3.87–5.11)
RDW: 14.7 % (ref 11.5–15.5)
WBC: 4.1 10*3/uL (ref 4.0–10.5)
nRBC: 0 % (ref 0.0–0.2)

## 2021-10-11 LAB — COMPREHENSIVE METABOLIC PANEL
ALT: 9 U/L (ref 0–44)
AST: 21 U/L (ref 15–41)
Albumin: 3.7 g/dL (ref 3.5–5.0)
Alkaline Phosphatase: 77 U/L (ref 38–126)
Anion gap: 8 (ref 5–15)
BUN: 22 mg/dL (ref 8–23)
CO2: 25 mmol/L (ref 22–32)
Calcium: 8.7 mg/dL — ABNORMAL LOW (ref 8.9–10.3)
Chloride: 107 mmol/L (ref 98–111)
Creatinine, Ser: 0.58 mg/dL (ref 0.44–1.00)
GFR, Estimated: >60 mL/min (ref >60)
Glucose, Bld: 128 mg/dL — ABNORMAL HIGH (ref 70–99)
Potassium: 3.7 mmol/L (ref 3.5–5.1)
Sodium: 140 mmol/L (ref 135–145)
Total Bilirubin: 1.7 mg/dL — ABNORMAL HIGH (ref 0.3–1.2)
Total Protein: 7.1 g/dL (ref 6.5–8.1)

## 2021-10-11 LAB — RESP PANEL BY RT-PCR (FLU A&B, COVID) ARPGX2
Influenza A by PCR: NEGATIVE
Influenza B by PCR: NEGATIVE
SARS Coronavirus 2 by RT PCR: POSITIVE — AB

## 2021-10-11 LAB — TSH: TSH: 0.794 u[IU]/mL (ref 0.350–4.500)

## 2021-10-11 LAB — PROCALCITONIN: Procalcitonin: <0.1 ng/mL

## 2021-10-11 LAB — BRAIN NATRIURETIC PEPTIDE: B Natriuretic Peptide: 394.5 pg/mL — ABNORMAL HIGH (ref 0.0–100.0)

## 2021-10-11 LAB — IRON AND TIBC
Iron: 19 ug/dL — ABNORMAL LOW (ref 28–170)
Saturation Ratios: 8 % — ABNORMAL LOW (ref 10.4–31.8)
TIBC: 234 ug/dL — ABNORMAL LOW (ref 250–450)
UIBC: 215 ug/dL

## 2021-10-11 LAB — FERRITIN: Ferritin: 76 ng/mL (ref 11–307)

## 2021-10-11 LAB — TROPONIN I (HIGH SENSITIVITY)
Troponin I (High Sensitivity): 30 ng/L — ABNORMAL HIGH (ref <18)
Troponin I (High Sensitivity): 28 ng/L — ABNORMAL HIGH (ref <18)

## 2021-10-11 MED ORDER — ONDANSETRON HCL 4 MG PO TABS: 4.0000 mg | ORAL_TABLET | Freq: Four times a day (QID) | ORAL | Status: DC | PRN

## 2021-10-11 MED ORDER — LEVOTHYROXINE SODIUM 50 MCG PO TABS
50.0000 ug | ORAL_TABLET | Freq: Every day | ORAL | Status: DC
  Administered 2021-10-12 – 2021-10-14 (×3): 50 ug via ORAL
  Filled 2021-10-11 (×3): qty 1

## 2021-10-11 MED ORDER — FUROSEMIDE 10 MG/ML IJ SOLN
20.0000 mg | Freq: Once | INTRAMUSCULAR | Status: AC
  Administered 2021-10-11: 20 mg via INTRAVENOUS
  Filled 2021-10-11: qty 4

## 2021-10-11 MED ORDER — FLUTICASONE FUROATE-VILANTEROL 100-25 MCG/ACT IN AEPB
1.0000 | INHALATION_SPRAY | Freq: Every day | RESPIRATORY_TRACT | Status: DC
  Administered 2021-10-13 – 2021-10-14 (×2): 1 via RESPIRATORY_TRACT
  Filled 2021-10-11 (×2): qty 28

## 2021-10-11 MED ORDER — ISOSORBIDE MONONITRATE ER 30 MG PO TB24
30.0000 mg | ORAL_TABLET | Freq: Every day | ORAL | Status: DC
  Administered 2021-10-12 – 2021-10-14 (×3): 30 mg via ORAL
  Filled 2021-10-11 (×3): qty 1

## 2021-10-11 MED ORDER — ENOXAPARIN SODIUM 40 MG/0.4ML IJ SOSY
40.0000 mg | PREFILLED_SYRINGE | INTRAMUSCULAR | Status: DC
  Administered 2021-10-11 – 2021-10-13 (×3): 40 mg via SUBCUTANEOUS
  Filled 2021-10-11 (×3): qty 0.4

## 2021-10-11 MED ORDER — ENOXAPARIN SODIUM 80 MG/0.8ML IJ SOSY
1.0000 mg/kg | PREFILLED_SYRINGE | Freq: Two times a day (BID) | INTRAMUSCULAR | Status: DC
  Filled 2021-10-11: qty 0.72

## 2021-10-11 MED ORDER — TRAZODONE HCL 50 MG PO TABS: 25.0000 mg | ORAL_TABLET | Freq: Every evening | ORAL | Status: DC | PRN

## 2021-10-11 MED ORDER — NIRMATRELVIR/RITONAVIR (PAXLOVID) TABLET (RENAL DOSING)
2.0000 | ORAL_TABLET | Freq: Two times a day (BID) | ORAL | Status: DC
  Filled 2021-10-11: qty 20

## 2021-10-11 MED ORDER — AMLODIPINE BESYLATE 5 MG PO TABS
2.5000 mg | ORAL_TABLET | Freq: Every day | ORAL | Status: DC
  Administered 2021-10-12 – 2021-10-14 (×3): 2.5 mg via ORAL
  Filled 2021-10-11 (×3): qty 1

## 2021-10-11 MED ORDER — OXYCODONE HCL 5 MG PO TABS: 5.0000 mg | ORAL_TABLET | ORAL | Status: DC | PRN

## 2021-10-11 MED ORDER — SODIUM CHLORIDE 0.9% FLUSH
3.0000 mL | Freq: Two times a day (BID) | INTRAVENOUS | Status: DC
  Administered 2021-10-11 – 2021-10-12 (×3): 3 mL via INTRAVENOUS

## 2021-10-11 MED ORDER — ASPIRIN 81 MG PO CHEW
324.0000 mg | CHEWABLE_TABLET | Freq: Once | ORAL | Status: AC
  Administered 2021-10-11: 324 mg via ORAL
  Filled 2021-10-11: qty 4

## 2021-10-11 MED ORDER — POTASSIUM CHLORIDE CRYS ER 20 MEQ PO TBCR
20.0000 meq | EXTENDED_RELEASE_TABLET | Freq: Two times a day (BID) | ORAL | Status: DC
  Administered 2021-10-11 – 2021-10-14 (×6): 20 meq via ORAL
  Filled 2021-10-11 (×6): qty 1

## 2021-10-11 MED ORDER — ACETAMINOPHEN 325 MG PO TABS: 650.0000 mg | ORAL_TABLET | Freq: Four times a day (QID) | ORAL | Status: DC | PRN

## 2021-10-11 MED ORDER — ACETAMINOPHEN 650 MG RE SUPP
650.0000 mg | Freq: Four times a day (QID) | RECTAL | Status: DC | PRN
Start: 2021-10-11 — End: 2021-10-14

## 2021-10-11 MED ORDER — VITAMIN B-12 1000 MCG PO TABS
1000.0000 ug | ORAL_TABLET | Freq: Every day | ORAL | Status: DC
  Administered 2021-10-12 – 2021-10-14 (×3): 1000 ug via ORAL
  Filled 2021-10-11 (×3): qty 1

## 2021-10-11 MED ORDER — ONDANSETRON HCL 4 MG/2ML IJ SOLN
4.0000 mg | Freq: Four times a day (QID) | INTRAMUSCULAR | Status: DC | PRN
Start: 2021-10-11 — End: 2021-10-14

## 2021-10-11 MED ORDER — FUROSEMIDE 10 MG/ML IJ SOLN
20.0000 mg | Freq: Two times a day (BID) | INTRAMUSCULAR | Status: DC
  Administered 2021-10-11 – 2021-10-12 (×3): 20 mg via INTRAVENOUS
  Filled 2021-10-11: qty 2
  Filled 2021-10-11 (×2): qty 4

## 2021-10-11 MED ORDER — ALBUTEROL SULFATE (2.5 MG/3ML) 0.083% IN NEBU: 2.5000 mg | INHALATION_SOLUTION | RESPIRATORY_TRACT | Status: DC | PRN

## 2021-10-11 MED ORDER — NIRMATRELVIR/RITONAVIR (PAXLOVID) TABLET (RENAL DOSING)
2.0000 | ORAL_TABLET | Freq: Two times a day (BID) | ORAL | Status: DC
Start: 2021-10-11 — End: 2021-10-14
  Administered 2021-10-11 – 2021-10-14 (×6): 2 via ORAL
  Filled 2021-10-11: qty 20

## 2021-10-11 MED ORDER — POLYETHYLENE GLYCOL 3350 17 G PO PACK: 17.0000 g | PACK | Freq: Every day | ORAL | Status: DC | PRN

## 2021-10-11 MED ORDER — DONEPEZIL HCL 5 MG PO TABS
5.0000 mg | ORAL_TABLET | Freq: Every day | ORAL | Status: DC
  Administered 2021-10-12 – 2021-10-14 (×3): 5 mg via ORAL
  Filled 2021-10-11 (×3): qty 1

## 2021-10-11 NOTE — Assessment & Plan Note (Signed)
Continue Synthroid °

## 2021-10-11 NOTE — ED Provider Notes (Addendum)
? ?[facility] ?Provider Note ? ? ? Event Date/Time  ? First MD Initiated Contact with Patient 10/11/21 1157   ?  (approximate) ? ? ?History  ? ?Shortness of Breath ? ? ?HPI ? ?Ann [name] is a 88 y.o. female with past medical history of HTN, aortic stenosis, memory loss on donepezil and and Fridays who presents coming by her family for evaluation of some shortness of breath and congestion and cough that started yesterday.  Patient also complains of some substernal chest discomfort on and off for the last day or so.  No headache, fever, vomiting, diarrhea, burning urination or falls or injuries.  She denies any other sick symptoms.  She did use an albuterol inhaler earlier today but is not sure if it helped much.  She has no other acute concerns ?  ? ? ?Physical Exam  ?Triage Vital Signs: ?ED Triage Vitals  ?Enc Vitals Group  ?   BP 10/11/21 1141 132/74  ?   Pulse Rate 10/11/21 1141 67  ?   Resp 10/11/21 1141 (!) 22  ?   Temp 10/11/21 1148 98.6 ?F (37 ?C)  ?   Temp Source 10/11/21 1148 Oral  ?   SpO2 10/11/21 1141 94 %  ?   Weight 10/11/21 1138 160 lb (72.6 kg)  ?   Height 10/11/21 1138 5\' 7"  (1.702 m)  ?   Head Circumference --   ?   Peak Flow --   ?   Pain Score 10/11/21 1137 0  ?   Pain Loc --   ?   Pain Edu? --   ?   Excl. in GC? --   ? ? ?Most recent vital signs: ?Vitals:  ? 10/11/21 1141 10/11/21 1148  ?BP: 132/74   ?Pulse: 67   ?Resp: (!) 22   ?Temp:  98.6 ?F (37 ?C)  ?SpO2: 94%   ? ? ?General: Sleep on initial arrival but easily awakens. ?CV:  Good peripheral perfusion.  2+ radial pulse.  Audible systolic murmur. ?Resp:  Normal effort.  Clear bilaterally. ?Abd:  No distention.  Soft throughout. ?Other:  Some mild lower extremity edema symmetric. ? ? ?ED Results / Procedures / Treatments  ?Labs ?(all labs ordered are listed, but only abnormal results are displayed) ?Labs Reviewed  ?RESP PANEL BY RT-PCR (FLU A&B, COVID) ARPGX2 - Abnormal; Notable for the following components:  ?     Result Value  ? SARS Coronavirus 2 by RT PCR POSITIVE (*)   ? All other components within normal limits  ?CBC WITH DIFFERENTIAL/PLATELET - Abnormal; Notable for the following components:  ? RBC 3.64 (*)   ? Hemoglobin 11.1 (*)   ? HCT 35.3 (*)   ? Lymphs Abs 0.6 (*)   ? All other components within normal limits  ?COMPREHENSIVE METABOLIC PANEL - Abnormal; Notable for the following components:  ? Glucose, Bld 128 (*)   ? Calcium 8.7 (*)   ? Total Bilirubin 1.7 (*)   ? All other components within normal limits  ?BRAIN NATRIURETIC PEPTIDE - Abnormal; Notable for the following components:  ? B Natriuretic Peptide 394.5 (*)   ? All other components within normal limits  ?TROPONIN I (HIGH SENSITIVITY) - Abnormal; Notable for the following components:  ? Troponin I (High Sensitivity) 30 (*)   ? All other components within normal limits  ?PROCALCITONIN  ?TROPONIN I (HIGH SENSITIVITY)  ? ? ? ?EKG ? ?ECG is remarkable for sinus rhythm with significant sinus arrhythmia and a  ventricular rate of 76, normal axis, unremarkable intervals without clear evidence of acute ischemia or significant arrhythmia. ? ?RADIOLOGY ?Chest x-ray shows no focal consolidation but does show calcification of the aortic knob there is also evidence of what appears to be early pulmonary edema versus early bilateral pneumonia.  No large effusion.  I also reviewed radiologist interpretation. ? ?PROCEDURES: ? ?Critical Care performed: No ? ?.1-3 Lead EKG Interpretation ?Performed by: Gilles Chiquito, MD ?Authorized by: Gilles Chiquito, MD  ? ?  Interpretation: non-specific   ?  ECG rate assessment: normal   ?  Rhythm: sinus rhythm   ?  Ectopy: none   ?  Conduction: normal   ? ?The patient is on the cardiac monitor to evaluate for evidence of arrhythmia and/or significant heart rate changes. ? ? ?MEDICATIONS ORDERED IN ED: ?Medications  ?aspirin chewable tablet 324 mg (has no administration in time range)  ?furosemide (LASIX) injection 20 mg (has no  administration in time range)  ? ? ? ?IMPRESSION / MDM / ASSESSMENT AND PLAN / ED COURSE  ?I reviewed the triage vital signs and the nursing notes. ?             ?               ? ?Differential diagnosis includes, but is not limited to colitis, pneumonia, CHF, symptomatic aortic stenosis, ACS and PE. ? ?ECG is remarkable for A-fib with a ventricular rate of 76, otherwise unremarkable intervals without clear evidence of acute ischemia or significant arrhythmia. ? ?Chest x-ray shows no focal consolidation but does show calcification of the aortic knob there is also evidence of what appears to be early pulmonary edema versus early bilateral pneumonia.  No large effusion.  I also reviewed radiologist interpretation. ? ?CBC shows no leukocytosis and hemoglobin of 11.1.  No recent hemoglobin visible in EHR for comparison.  CMP shows no significant electrolyte or metabolic derangements.  BNP is elevated at 394.5 and is congruent with findings on chest x-ray supporting diagnosis of early mild systolic heart failure.  It is also slightly elevated at 30 which could certainly represent some demand ischemia.  Patient has no ongoing chest pain at this time with relatively otherwise unremarkable EKG and have a lower suspicion for an occlusion MI although we will plan to obtain a 2-hour delta troponin.  Given evidence of edema with elevated BNP suggesting left-sided systolic heart failure I am less concerned at this time for acute PE.  We will start patient on low-dose Lasix and admit to medicine service for further evaluation and management.  We will have a lower suspicion for infectious process at this time we will also send a procalcitonin given report of cough with sputum production.  Procalcitonin is negative but patient's COVID PCR did return positive. ? ?  ? ? ?FINAL CLINICAL IMPRESSION(S) / ED DIAGNOSES  ? ?Final diagnoses:  ?Acute congestive heart failure, unspecified heart failure type (HCC)  ?Troponin I above reference  range  ?Elevated brain natriuretic peptide (BNP) level  ?SOB (shortness of breath)  ?COVID  ? ? ? ?Rx / DC Orders  ? ?ED Discharge Orders   ? ? None  ? ?  ? ? ? ?Note:  This document was prepared using Dragon voice recognition software and may include unintentional dictation errors. ?  ?Gilles Chiquito, MD ?10/11/21 1332 ? ?  ?Gilles Chiquito, MD ?10/11/21 1427 ? ?

## 2021-10-11 NOTE — Assessment & Plan Note (Addendum)
BNP and troponin elevated, some lower lung field crackles along with JVD elevation on exam, and history of some valvular disease in setting of new worsening shortness of breath concerning for new CHF. ?- Cardiology consultation has been requested ?- Telemetry ?- Echocardiogram ?- Lasix 20 mg IV twice daily ?- Trend lytes, replete as needed ?- Daily weights ?- Strict I's and O's ?- Check TSH, B12, iron studies ?

## 2021-10-11 NOTE — ED Triage Notes (Signed)
Pt has been having SHOB, congested, and weakness- pt has had all her vaccines including covid, flu, and pneumonia- states it started yesterday- pt does have audible wheezing ?

## 2021-10-11 NOTE — ED Notes (Signed)
Pt was alone in the room. Got up and went to the bathroom despite being on the purwick. Pt was assisted to bathroom by another clinician. When RN arrived pt was sitting on the toilet. Pt pulled off all cardiac monitoring and urinated on the floor. Pt was returned back to bed. Bed alarm on. New purwick placed. Full set of sheets and gown provided. Pt wearing yellow hospital socks, bed alarm is on, and fall rick magnet placed outside room. Pt explain importance of calling out,demonstrated use of call bell, call bell at bedside. Family at bedside also educated on importance of fall prevention, will inform RN if they leave.  ?

## 2021-10-11 NOTE — Assessment & Plan Note (Addendum)
Likely also contributing to her symptoms though still technically only a mild case assuming cardiac issues are only incidental to this diagnosis. She is high risk for disease progression. ?- Renally dosed Paxlovid x5 days ?

## 2021-10-11 NOTE — Assessment & Plan Note (Signed)
- 

## 2021-10-11 NOTE — Consult Note (Signed)
Ann [name] ? ?[name] CONSULT NOTE  ?Patient ID: ?Ann [name] ?MRN: [id number] ?DOB/AGE: 1920-02-03 88 y.o. ? ?Admit date: 10/11/2021 ?Referring Physician Merian Capron ?Primary Physician Jaclyn Shaggy, MD ?Primary Cardiologist Armando Reichert, MD ?Reason for Consultation New heart failure, COVID ? ?HPI:  ?Ann [name] is a 88 year old female with history of mild aortic stenosis, moderate MR, hypertension, hyperlipidemia, COPD who presents to the emergency department with shortness of breath and was discovered to be COVID-positive. ? ?Patient reports that she has been short of breath with some nasal congestion over the last 2 days, as well as weakness prompting her presentation. She has had a non-productive cough as well. She has mild LE edema, not noticeably different than baseline. No orthopnea or PND.  ? ?On arrival to the emergency department her heart rate is 67, blood pressure 132/74, oxygen saturation 94% on room air.  COVID PCR positive.  Additional labs notable for hemoglobin 11.1, troponin 30, BNP 394.  EKG shows no ischemia, but a relatively irregular rhythm with P waves present.  She is admitted to the hospital for further evaluation. ? ? ? ?Review of systems complete and found to be negative unless listed above  ? ? ? ?Past Medical History:  ?Diagnosis Date  ? COPD (chronic obstructive pulmonary disease) (HCC)   ? High cholesterol   ? Thyroid disease   ?  ?Past Surgical History:  ?Procedure Laterality Date  ? APPENDECTOMY    ? JOINT REPLACEMENT    ? THYROID SURGERY    ?  ?(Not in a hospital admission) ? ?Social History  ? ?Socioeconomic History  ? Marital status: Married  ?  Spouse name: Not on file  ? Number of children: Not on file  ? Years of education: Not on file  ? Highest education level: Not on file  ?Occupational History  ? Not on file  ?Tobacco Use  ? Smoking status: Never  ? Smokeless tobacco: Not on file  ?Substance and Sexual Activity  ? Alcohol use: Not on file  ? Drug use: Not  on file  ? Sexual activity: Not on file  ?Other Topics Concern  ? Not on file  ?Social History Narrative  ? Not on file  ? ?Social Determinants of Health  ? ?Financial Resource Strain: Not on file  ?Food Insecurity: Not on file  ?Transportation Needs: Not on file  ?Physical Activity: Not on file  ?Stress: Not on file  ?Social Connections: Not on file  ?Intimate Partner Violence: Not on file  ?  ?No family history on file.  ? ? ?Review of systems complete and found to be negative unless listed above  ? ? ? ? ?PHYSICAL EXAM ? ?General: Well developed, well nourished, in no acute distress ?HEENT:  Normocephalic and atramatic ?Neck:  No JVD.  ?Lungs: Clear bilaterally to auscultation and percussion. ?Heart: HRRR . Normal S1 and S2 without gallops or murmurs.  ?Abdomen: Bowel sounds are positive, abdomen soft and non-tender  ?Msk:  Back normal, normal gait. Normal strength and tone for age. ?Extremities: No clubbing, cyanosis or edema.   ?Neuro: Alert and oriented X 3. ?Psych:  Good affect, responds appropriately ? ?Labs: ?  ?Lab Results  ?Component Value Date  ? WBC 4.1 10/11/2021  ? HGB 11.1 (L) 10/11/2021  ? HCT 35.3 (L) 10/11/2021  ? MCV 97.0 10/11/2021  ? PLT 220 10/11/2021  ?  ?Recent Labs  ?Lab 10/11/21 ?1155  ?NA 140  ?K 3.7  ?CL 107  ?CO2 25  ?  BUN 22  ?CREATININE 0.58  ?CALCIUM 8.7*  ?PROT 7.1  ?BILITOT 1.7*  ?ALKPHOS 77  ?ALT 9  ?AST 21  ?GLUCOSE 128*  ? ?Lab Results  ?Component Value Date  ? TROPONINI 0.04 (H) 07/15/2015  ?  ?Lab Results  ?Component Value Date  ? CHOL 177 08/25/2011  ? ?Lab Results  ?Component Value Date  ? HDL 88 (H) 08/25/2011  ? ?Lab Results  ?Component Value Date  ? LDLCALC 83 08/25/2011  ? ?Lab Results  ?Component Value Date  ? TRIG 31 08/25/2011  ? ?No results found for: CHOLHDL ?No results found for: LDLDIRECT  ?  ?Radiology: DG Chest 2 View ? ?Result Date: 10/11/2021 ?CLINICAL DATA:  Cough and shortness of breath EXAM: CHEST - 2 VIEW COMPARISON:  06/23/2016 FINDINGS: Chronic  cardiomegaly, aortic atherosclerosis and aortic tortuosity. Increased interstitial lung markings compared to the previous study raising the possibility of early pneumonia or interstitial edema. No pleural effusion. No acute bone finding. IMPRESSION: Chronic cardiomegaly, aortic atherosclerosis and aortic tortuosity. Increased interstitial lung markings which could reflect pneumonia or early interstitial edema. Electronically Signed   By: Paulina Fusi M.D.   On: 10/11/2021 12:36   ? ?EKG: Sinus rhythm with frequent PACs. Very irregular (? MAT). No acute ischemia  ? ?ASSESSMENT AND PLAN:  ?Briceida Rasberry is a 88 year old female with history of mild aortic stenosis, moderate MR, hypertension, hyperlipidemia, COPD who presents to the emergency department with shortness of breath and was discovered to be COVID-positive. ? ?#COVID-pneumonia ?#Mild aortic stenosis ?#Possible new onset heart failure ?Patient is admitted with 1 week shortness of breath and URI symptoms and discovered to be COVID-positive.  In the setting her BNP is mildly elevated, though she does not appear grossly volume overloaded.. ?- Treat COVID as you are doing ?-Agree with attempted gentle diuresis with 20 mg of IV Lasix ?-Continue home imdur and aspirin ?- Echocardiogram complete ? ?# irregular heartbeat ?EKG is not consistent with atrial fibrillation, and rather shows sinus rhythm with frequent PACs or possibly MAT. ?-No indication for anticoagulation. ? ?Signed: ?Armando Reichert MD ?10/11/2021, 4:41 PM ? ?

## 2021-10-11 NOTE — H&P (Signed)
?History and Physical  ? ? ?Patient: Ann [name] ZOX:[id number] DOB: 22-Mar-1920 ?DOA: 10/11/2021 ?DOS: the patient was seen and examined on 10/11/2021 ?PCP: Jaclyn Shaggy, MD  ?Patient coming from: Home ? ?Chief Complaint:  ?Chief Complaint  ?Patient presents with  ? Shortness of Breath  ? ?HPI: Ann [name] is a 88 y.o. female with medical history significant for hypertension, hyperlipidemia, mild aortic stenosis, who presents with shortness of breath. ? ?Patient presents with family for symptom of shortness of breath for about a week.  Family also notes that she has had some congestion over this time as well.  Denies having any periods of chest pain or palpitations, denies any lower extremity edema. ? ?In the ED vital signs notable only for mild tachypnea in the low 20s.  CBC showed only mild normocytic anemia with hemoglobin of 11.1, CMP was unremarkable with exception of total bilirubin which was 1.7, up from 0.6 on last check.  BNP was elevated at 394, was 92 on last check 6 years prior.  Troponin was also mildly elevated on first check at 30, trend is pending.  Chest x-ray was obtained which showed interstitial markings consistent with edema versus pneumonia.  EKG showed possible new atrial fibrillation as well as diffuse T wave flattening most pronounced in the inferior lateral leads.  Respiratory panel came back negative for influenza but positive for COVID. Procalcitonin returned negative.  She was given full dose aspirin, 20 mg IV Lasix, and admitted for further management. ? ? ?Review of Systems: As mentioned in the history of present illness. All other systems reviewed and are negative. ?Past Medical History:  ?Diagnosis Date  ? COPD (chronic obstructive pulmonary disease) (HCC)   ? High cholesterol   ? Thyroid disease   ? ?Past Surgical History:  ?Procedure Laterality Date  ? APPENDECTOMY    ? JOINT REPLACEMENT    ? THYROID SURGERY    ? ?Social History:  reports that she has never smoked. She does not  have any smokeless tobacco history on file. No history on file for alcohol use and drug use. ? ?Allergies  ?Allergen Reactions  ? Penicillins   ? ? ?No family history on file. ? ?Prior to Admission medications   ?Medication Sig Start Date End Date Taking? Authorizing Provider  ?meloxicam (MOBIC) 7.5 MG tablet Take by mouth. 07/23/20  Yes [provider]  ?acetaminophen (TYLENOL) 325 MG tablet Take 650 mg by mouth every 6 (six) hours as needed for headache. For pain    [provider]  ?albuterol (PROVENTIL HFA;VENTOLIN HFA) 108 (90 Base) MCG/ACT inhaler Inhale 2 puffs into the lungs every 6 (six) hours as needed for wheezing or shortness of breath. Dispense 1 spacer to use with the inhaler 07/15/15   Arnaldo Natal, MD  ?amLODipine (NORVASC) 2.5 MG tablet Take 2.5 mg by mouth daily. 07/01/21   [provider]  ?Earlie Server 100-25 MCG/ACT AEPB 1 puff daily. 09/24/21   [provider]  ?Cyanocobalamin (RA VITAMIN B-12 TR) 1000 MCG TBCR Take 1 tablet by mouth daily.    [provider]  ?cyanocobalamin 1000 MCG tablet Take by mouth.    [provider]  ?donepezil (ARICEPT) 5 MG tablet Take 5 mg by mouth daily. 04/24/21   [provider]  ?ferrous sulfate 325 (65 FE) MG tablet Take 325 mg by mouth daily.    [provider]  ?isosorbide mononitrate (IMDUR) 30 MG 24 hr tablet Take 30 mg by mouth daily. 05/08/21  [provider]  ?levothyroxine (SYNTHROID, LEVOTHROID) 50 MCG tablet Take 50 mcg by mouth daily.    [provider]  ?losartan (COZAAR) 50 MG tablet Take 50 mg by mouth daily. 08/07/15 08/06/16  [provider]  ?magnesium oxide (MAG-OX) 400 MG tablet Take 400 mg by mouth daily.    [provider]  ?Potassium 99 MG TABS Take 1 tablet by mouth daily.    [provider]  ?SYMBICORT 160-4.5 MCG/ACT inhaler Inhale 1 puff into the lungs daily. 07/15/15   [provider]  ? ? ?Physical Exam: ?Vitals:  ?  10/11/21 1138 10/11/21 1141 10/11/21 1148  ?BP:  132/74   ?Pulse:  67   ?Resp:  (!) 22   ?Temp:   98.6 ?F (37 ?C)  ?TempSrc:   Oral  ?SpO2:  94%   ?Weight: 72.6 kg    ?Height: 5\' 7"  (1.702 m)    ? ?Physical Exam ?Constitutional:   ?   General: She is not in acute distress. ?   Appearance: She is not ill-appearing or diaphoretic.  ?Cardiovascular:  ?   Rate and Rhythm: Normal rate. Rhythm irregular.  ?   Heart sounds: No murmur heard. ?   Comments: JVD elevated to below angle of jaw with bed at about 40 degrees ?Pulmonary:  ?   Effort: Pulmonary effort is normal. No tachypnea or respiratory distress.  ?   Breath sounds: Examination of the right-lower field reveals decreased breath sounds and rales. Examination of the left-lower field reveals decreased breath sounds and rales. Decreased breath sounds and rales present. No wheezing or rhonchi.  ?Abdominal:  ?   General: Bowel sounds are normal.  ?   Palpations: Abdomen is soft. There is no hepatomegaly or mass.  ?   Tenderness: There is no abdominal tenderness.  ?Musculoskeletal:  ?   Right lower leg: No edema.  ?   Left lower leg: No edema.  ?Skin: ?   General: Skin is warm and dry.  ?Neurological:  ?   General: No focal deficit present.  ?   Mental Status: She is alert.  ?Psychiatric:     ?   Mood and Affect: Mood normal.     ?   Behavior: Behavior normal.  ? ? ?Data Reviewed: ? ?ECG (my read): Compared to prior EKG now shows possible atrial fibrillation though sinus rhythm with atrial arrhythmia also seems possible based on appearance.  Overall amplitude compared to prior significantly reduced.  Diffuse flattening of T waves most pronounced in the inferior lateral leads.  Is also new.  No ST depressions or elevations appreciated. ? ?Results for orders placed or performed during the hospital encounter of 10/11/21 (from the past 24 hour(s))  ?CBC with Differential     Status: Abnormal  ? Collection Time: 10/11/21 11:55 AM  ?Result Value Ref Range  ? WBC 4.1 4.0 - 10.5  K/uL  ? RBC 3.64 (L) 3.87 - 5.11 MIL/uL  ? Hemoglobin 11.1 (L) 12.0 - 15.0 g/dL  ? HCT 35.3 (L) 36.0 - 46.0 %  ? MCV 97.0 80.0 - 100.0 fL  ? MCH 30.5 26.0 - 34.0 pg  ? MCHC 31.4 30.0 - 36.0 g/dL  ? RDW 14.7 11.5 - 15.5 %  ? Platelets 220 150 - 400 K/uL  ? nRBC 0.0 0.0 - 0.2 %  ? Neutrophils Relative % 70 %  ? Neutro Abs 2.9 1.7 - 7.7 K/uL  ? Lymphocytes Relative 15 %  ? Lymphs Abs 0.6 (L)  0.7 - 4.0 K/uL  ? Monocytes Relative 13 %  ? Monocytes Absolute 0.5 0.1 - 1.0 K/uL  ? Eosinophils Relative 0 %  ? Eosinophils Absolute 0.0 0.0 - 0.5 K/uL  ? Basophils Relative 1 %  ? Basophils Absolute 0.0 0.0 - 0.1 K/uL  ? Immature Granulocytes 1 %  ? Abs Immature Granulocytes 0.04 0.00 - 0.07 K/uL  ?Comprehensive metabolic panel     Status: Abnormal  ? Collection Time: 10/11/21 11:55 AM  ?Result Value Ref Range  ? Sodium 140 135 - 145 mmol/L  ? Potassium 3.7 3.5 - 5.1 mmol/L  ? Chloride 107 98 - 111 mmol/L  ? CO2 25 22 - 32 mmol/L  ? Glucose, Bld 128 (H) 70 - 99 mg/dL  ? BUN 22 8 - 23 mg/dL  ? Creatinine, Ser 0.58 0.44 - 1.00 mg/dL  ? Calcium 8.7 (L) 8.9 - 10.3 mg/dL  ? Total Protein 7.1 6.5 - 8.1 g/dL  ? Albumin 3.7 3.5 - 5.0 g/dL  ? AST 21 15 - 41 U/L  ? ALT 9 0 - 44 U/L  ? Alkaline Phosphatase 77 38 - 126 U/L  ? Total Bilirubin 1.7 (H) 0.3 - 1.2 mg/dL  ? GFR, Estimated >60 >60 mL/min  ? Anion gap 8 5 - 15  ?Brain natriuretic peptide     Status: Abnormal  ? Collection Time: 10/11/21 11:55 AM  ?Result Value Ref Range  ? B Natriuretic Peptide 394.5 (H) 0.0 - 100.0 pg/mL  ?Procalcitonin - Baseline     Status: None  ? Collection Time: 10/11/21 11:55 AM  ?Result Value Ref Range  ? Procalcitonin <0.10 ng/mL  ?Troponin I (High Sensitivity)     Status: Abnormal  ? Collection Time: 10/11/21 11:55 AM  ?Result Value Ref Range  ? Troponin I (High Sensitivity) 30 (H) <18 ng/L  ?Resp Panel by RT-PCR (Flu A&B, Covid) Nasopharyngeal Swab     Status: Abnormal  ? Collection Time: 10/11/21  1:01 PM  ? Specimen: Nasopharyngeal Swab;  Nasopharyngeal(NP) swabs in vial transport medium  ?Result Value Ref Range  ? SARS Coronavirus 2 by RT PCR POSITIVE (A) NEGATIVE  ? Influenza A by PCR NEGATIVE NEGATIVE  ? Influenza B by PCR NEGATIVE NEGATIVE  ?Trop

## 2021-10-11 NOTE — Assessment & Plan Note (Addendum)
EKG auto read reporting atrial fibrillation, however on cardiology evaluation does not appear consistent with A-fib. ?

## 2021-10-11 NOTE — Assessment & Plan Note (Signed)
Unclear etiology, will recheck and obtain right upper quadrant ultrasound if persistently elevated. ?

## 2021-10-11 NOTE — Assessment & Plan Note (Signed)
Suspect demand ischemia in setting of volume overload, trend to peak. ?

## 2021-10-11 NOTE — ED Notes (Signed)
ED TO INPATIENT HANDOFF REPORT ? ?ED Nurse Name and Phone #: Fredric Mare ? ?S ?Name/Age/Gender ?Ann [name] ?88 y.o. ?female ?Room/Bed: ED13A/ED13A ? ?Code Status ?  Code Status: Full Code ? ?Home/SNF/Other ?Home ?Patient oriented to: self, place, time, and situation ?Is this baseline? Yes  ? ?Triage Complete: Triage complete  ?Chief Complaint ?Acute CHF (congestive heart failure) (HCC) [I50.9] ? ?Triage Note ?Pt has been having SHOB, congested, and weakness- pt has had all her vaccines including covid, flu, and pneumonia- states it started yesterday- pt does have audible wheezing  ? ?Allergies ?Allergies  ?Allergen Reactions  ? Penicillins   ? ? ?Level of Care/Admitting Diagnosis ?ED Disposition   ? ? ED Disposition  ?Admit  ? Condition  ?--  ? Comment  ?Hospital Area: [facility] [[id number]] ? Level of Care: Telemetry Cardiac [103] ? Covid Evaluation: Asymptomatic - no recent exposure (last 10 days) testing not required ? Diagnosis: Acute CHF (congestive heart failure) (HCC) [161096] ? Admitting Physician: Venora Maples [[id number]] ? Attending Physician: Venora Maples [[id number]] ? Estimated length of stay: past midnight tomorrow ? Certification:: I certify this patient will need inpatient services for at least 2 midnights ?  ?  ? ?  ? ? ?B ?Medical/Surgery History ?Past Medical History:  ?Diagnosis Date  ? COPD (chronic obstructive pulmonary disease) (HCC)   ? High cholesterol   ? Thyroid disease   ? ?Past Surgical History:  ?Procedure Laterality Date  ? APPENDECTOMY    ? JOINT REPLACEMENT    ? THYROID SURGERY    ?  ? ?A ?IV Location/Drains/Wounds ?Patient Lines/Drains/Airways Status   ? ? Active Line/Drains/Airways   ? ? Name Placement date Placement time Site Days  ? Peripheral IV 10/11/21 22 G 1" Left;Posterior Forearm 10/11/21  1556  Forearm  less than 1  ? ?  ?  ? ?  ? ? ?Intake/Output Last 24 hours ?No intake or output data in the 24 hours ending 10/11/21 1936 ? ?Labs/Imaging ?Results  for orders placed or performed during the hospital encounter of 10/11/21 (from the past 48 hour(s))  ?CBC with Differential     Status: Abnormal  ? Collection Time: 10/11/21 11:55 AM  ?Result Value Ref Range  ? WBC 4.1 4.0 - 10.5 K/uL  ? RBC 3.64 (L) 3.87 - 5.11 MIL/uL  ? Hemoglobin 11.1 (L) 12.0 - 15.0 g/dL  ? HCT 35.3 (L) 36.0 - 46.0 %  ? MCV 97.0 80.0 - 100.0 fL  ? MCH 30.5 26.0 - 34.0 pg  ? MCHC 31.4 30.0 - 36.0 g/dL  ? RDW 14.7 11.5 - 15.5 %  ? Platelets 220 150 - 400 K/uL  ? nRBC 0.0 0.0 - 0.2 %  ? Neutrophils Relative % 70 %  ? Neutro Abs 2.9 1.7 - 7.7 K/uL  ? Lymphocytes Relative 15 %  ? Lymphs Abs 0.6 (L) 0.7 - 4.0 K/uL  ? Monocytes Relative 13 %  ? Monocytes Absolute 0.5 0.1 - 1.0 K/uL  ? Eosinophils Relative 0 %  ? Eosinophils Absolute 0.0 0.0 - 0.5 K/uL  ? Basophils Relative 1 %  ? Basophils Absolute 0.0 0.0 - 0.1 K/uL  ? Immature Granulocytes 1 %  ? Abs Immature Granulocytes 0.04 0.00 - 0.07 K/uL  ?  Comment: Performed at [facility], [address], [place], [place] [address]  ?Comprehensive metabolic panel     Status: Abnormal  ? Collection Time: 10/11/21 11:55 AM  ?Result Value Ref Range  ? Sodium  140 135 - 145 mmol/L  ? Potassium 3.7 3.5 - 5.1 mmol/L  ? Chloride 107 98 - 111 mmol/L  ? CO2 25 22 - 32 mmol/L  ? Glucose, Bld 128 (H) 70 - 99 mg/dL  ?  Comment: Glucose reference range applies only to samples taken after fasting for at least 8 hours.  ? BUN 22 8 - 23 mg/dL  ? Creatinine, Ser 0.58 0.44 - 1.00 mg/dL  ? Calcium 8.7 (L) 8.9 - 10.3 mg/dL  ? Total Protein 7.1 6.5 - 8.1 g/dL  ? Albumin 3.7 3.5 - 5.0 g/dL  ? AST 21 15 - 41 U/L  ? ALT 9 0 - 44 U/L  ? Alkaline Phosphatase 77 38 - 126 U/L  ? Total Bilirubin 1.7 (H) 0.3 - 1.2 mg/dL  ? GFR, Estimated >60 >60 mL/min  ?  Comment: (NOTE) ?Calculated using the CKD-EPI Creatinine Equation (2021) ?  ? Anion gap 8 5 - 15  ?  Comment: Performed at [facility], [address] [place], [place] [address]  ?Brain natriuretic peptide      Status: Abnormal  ? Collection Time: 10/11/21 11:55 AM  ?Result Value Ref Range  ? B Natriuretic Peptide 394.5 (H) 0.0 - 100.0 pg/mL  ?  Comment: Performed at [facility], [address] [place], [place] [address]  ?Procalcitonin - Baseline     Status: None  ? Collection Time: 10/11/21 11:55 AM  ?Result Value Ref Range  ? Procalcitonin <0.10 ng/mL  ?  Comment:        ?Interpretation: ?PCT (Procalcitonin) <= 0.5 ng/mL: ?Systemic infection (sepsis) is not likely. ?Local bacterial infection is possible. ?(NOTE) ?      Sepsis PCT Algorithm           Lower Respiratory Tract ?                                     Infection PCT Algorithm ?   ----------------------------     ---------------------------- ?        PCT < 0.25 ng/mL                PCT < 0.10 ng/mL ? ?        Strongly encourage             Strongly discourage ?  discontinuation of antibiotics    initiation of antibiotics ?   ----------------------------     ----------------------------- ?      PCT 0.25 - 0.50 ng/mL            PCT 0.10 - 0.25 ng/mL ?              OR ?      >80% decrease in PCT            Discourage initiation of ?                                           antibiotics ?     Encourage discontinuation ?          of antibiotics ?   ----------------------------     ----------------------------- ?        PCT >= 0.50 ng/mL              PCT 0.26 - 0.50  ng/mL ?              AND ?       <80% decrease in PCT             Encourage initiation of ?                                            antibiotics ?      Encourage continuation ?          of antibiotics ?   ----------------------------     ----------------------------- ?       PCT >= 0.50 ng/mL                  PCT > 0.50 ng/mL ?              AND ?        increase in PCT                  Strongly encourage ?                                     initiation of antibiotics ?   Strongly encourage escalation ?          of antibiotics ?                                    ----------------------------- ?                                           PCT <= 0.25 ng/mL ?                                                OR ?                                       > 80% decrease in PCT ? ?                                    Discontinue / Do not initiate ?                                            antibiotics ? ?Performed at [facility], [address] [place], ?[place] [address] ?  ?Troponin I (High Sensitivity)     Status: Abnormal  ? Collection Time: 10/11/21 11:55 AM  ?Result Value Ref Range  ? Troponin I (High Sensitivity) 30 (H) <18 ng/L  ?  Comment: (NOTE) ?Elevated high sensitivity troponin I (hsTnI) values and significant  ?changes across serial measurements may suggest ACS but many other  ?chronic and acute conditions are known to elevate hsTnI results.  ?Refer  to the "Links" section for chest pain algorithms and additional  ?guidance. ?Performed at [facility], [address] [place], ?[place] [address] ?  ?Resp Panel by RT-PCR (Flu A&B, Covid) Nasopharyngeal Swab     Status: Abnormal  ? Collection Time: 10/11/21  1:01 PM  ? Specimen: Nasopharyngeal Swab; Nasopharyngeal(NP) swabs in vial transport medium  ?Result Value Ref Range  ? SARS Coronavirus 2 by RT PCR POSITIVE (A) NEGATIVE  ?  Comment: (NOTE) ?SARS-CoV-2 target nucleic acids are DETECTED. ? ?The SARS-CoV-2 RNA is generally detectable in upper respiratory ?specimens during the acute phase of infection. Positive results are ?indicative of the presence of the identified virus, but do not rule ?out bacterial infection or co-infection with other pathogens not ?detected by the test. Clinical correlation with patient history and ?other diagnostic information is necessary to determine patient ?infection status. The expected result is Negative. ? ?Fact Sheet for Patients: ?BloggerCourse.com ? ?Fact Sheet for Healthcare Providers: ?SeriousBroker.it ? ?This test is not yet approved or cleared by  the [place] FDA and  ?has been authorized for detection and/or diagnosis of SARS-CoV-2 by ?FDA under an Emergency Use Authorization (EUA).  This EUA will ?remain in effect (meaning this test can be

## 2021-10-12 ENCOUNTER — Inpatient Hospital Stay: Admit: 2021-10-12 | Payer: Medicare HMO

## 2021-10-12 DIAGNOSIS — F0390 Unspecified dementia without behavioral disturbance: Secondary | ICD-10-CM | POA: Diagnosis not present

## 2021-10-12 DIAGNOSIS — U071 COVID-19: Secondary | ICD-10-CM

## 2021-10-12 DIAGNOSIS — I509 Heart failure, unspecified: Secondary | ICD-10-CM | POA: Diagnosis not present

## 2021-10-12 LAB — CBC
HCT: 34.4 % — ABNORMAL LOW (ref 36.0–46.0)
Hemoglobin: 10.9 g/dL — ABNORMAL LOW (ref 12.0–15.0)
MCH: 30.4 pg (ref 26.0–34.0)
MCHC: 31.7 g/dL (ref 30.0–36.0)
MCV: 95.8 fL (ref 80.0–100.0)
Platelets: 186 10*3/uL (ref 150–400)
RBC: 3.59 MIL/uL — ABNORMAL LOW (ref 3.87–5.11)
RDW: 14.7 % (ref 11.5–15.5)
WBC: 4.2 10*3/uL (ref 4.0–10.5)
nRBC: 0 % (ref 0.0–0.2)

## 2021-10-12 LAB — BASIC METABOLIC PANEL
Anion gap: 9 (ref 5–15)
BUN: 18 mg/dL (ref 8–23)
CO2: 27 mmol/L (ref 22–32)
Calcium: 8.5 mg/dL — ABNORMAL LOW (ref 8.9–10.3)
Chloride: 102 mmol/L (ref 98–111)
Creatinine, Ser: 0.6 mg/dL (ref 0.44–1.00)
GFR, Estimated: >60 mL/min (ref >60)
Glucose, Bld: 94 mg/dL (ref 70–99)
Potassium: 3.7 mmol/L (ref 3.5–5.1)
Sodium: 138 mmol/L (ref 135–145)

## 2021-10-12 MED ORDER — ALBUTEROL SULFATE (2.5 MG/3ML) 0.083% IN NEBU: 3.0000 mL | INHALATION_SOLUTION | RESPIRATORY_TRACT | Status: DC | PRN

## 2021-10-12 NOTE — ED Notes (Addendum)
Attending physician at bedside at this time  

## 2021-10-12 NOTE — Progress Notes (Addendum)
?PROGRESS NOTE ? ? ? ?Ann [name]  SAY:[id number] DOB: 01-08-1920 DOA: 10/11/2021 ?PCP: Jaclyn Shaggy, MD  ? ?Assessment & Plan: ?  ?Principal Problem: ?  Acute CHF (congestive heart failure) (HCC) ?Active Problems: ?  Aortic valve disease ?  Benign essential hypertension ?  Hypothyroid ?  Hyperlipidemia ?  Arrhythmia ?  COVID-19 virus infection ?  Elevated troponin ?  Hyperbilirubinemia ? ?Assessment and Plan: ?Acute CHF: unknown systolic vs diastolic vs mixed. Echo ordered. Continue on IV lasix. Monitor I/Os. Continue on imdur, aspirin. Cardio recs apprec  ? ?Hyperbilirubinemia: etiology unclear. Will continue to monitor  ?  ?Elevated troponins: likely secondary to demand ischemia  ?  ?COVID-19 pneumonia: continue on renally dose of paxlovid x 5 days. Continue on airborne & contact precautions  ?  ?Arrhythmia: possibly MAT as per cardio. ?  ?Hypothyroidism: continue on synthroid  ? ?HTN: continue on imdur, amlodipine  ? ?Dementia: continue on home dose of donepezil ? ?Asthma: unknown stage and/or severity. Continue on bronchodilators  ? ? ? ?DVT prophylaxis: lovenox  ?Code Status: full  ?Family Communication: called pt's daughter, Tyler Aas, no answer  ?Disposition Plan:  depends on PT/OT recs  ?  ?Level of care: Telemetry Cardiac ? ?Status is: Inpatient ?Remains inpatient appropriate because: severity of illness  ? ? ? ?Consultants:  ?Cardio  ? ?Procedures:  ? ?Antimicrobials:  ? ? ?Subjective: ?Pt c/o intermittent cough  ? ?Objective: ?Vitals:  ? 10/12/21 0600 10/12/21 0601 10/12/21 0646 10/12/21 0700  ?BP: (!) 168/109   (!) 160/80  ?Pulse: 63 92  64  ?Resp: (!) 23   19  ?Temp:   98.3 ?F (36.8 ?C)   ?TempSrc:   Oral   ?SpO2: 96% 98%  97%  ?Weight:      ?Height:      ? ? ?Intake/Output Summary (Last 24 hours) at 10/12/2021 0756 ?Last data filed at 10/12/2021 2355 ?Gross per 24 hour  ?Intake --  ?Output 1650 ml  ?Net -1650 ml  ? ?Filed Weights  ? 10/11/21 1138  ?Weight: 72.6 kg  ? ? ?Examination: ? ?General exam: Appears  calm and comfortable  ?Respiratory system: diminished breath sounds b/l  ?Cardiovascular system: S1 & S2+. No rubs, gallops or clicks.  ?Gastrointestinal system: Abdomen is nondistended, soft and nontender. Normal bowel sounds heard. ?Central nervous system: Alert and awake. Moves all extremities  ?Psychiatry: Judgement and insight appears poor. Flat mood and affect  ? ? ? ?Data Reviewed: I have personally reviewed following labs and imaging studies ? ?CBC: ?Recent Labs  ?Lab 10/11/21 ?1155 10/12/21 ?0646  ?WBC 4.1 4.2  ?NEUTROABS 2.9  --   ?HGB 11.1* 10.9*  ?HCT 35.3* 34.4*  ?MCV 97.0 95.8  ?PLT 220 186  ? ?Basic Metabolic Panel: ?Recent Labs  ?Lab 10/11/21 ?1155 10/12/21 ?0646  ?NA 140 138  ?K 3.7 3.7  ?CL 107 102  ?CO2 25 27  ?GLUCOSE 128* 94  ?BUN 22 18  ?CREATININE 0.58 0.60  ?CALCIUM 8.7* 8.5*  ? ?GFR: ?Estimated Creatinine Clearance: 37.3 mL/min (by C-G formula based on SCr of 0.6 mg/dL). ?Liver Function Tests: ?Recent Labs  ?Lab 10/11/21 ?1155  ?AST 21  ?ALT 9  ?ALKPHOS 77  ?BILITOT 1.7*  ?PROT 7.1  ?ALBUMIN 3.7  ? ?No results for input(s): LIPASE, AMYLASE in the last 168 hours. ?No results for input(s): AMMONIA in the last 168 hours. ?Coagulation Profile: ?No results for input(s): INR, PROTIME in the last 168 hours. ?Cardiac Enzymes: ?No results for input(s): CKTOTAL,  CKMB, CKMBINDEX, TROPONINI in the last 168 hours. ?BNP (last 3 results) ?No results for input(s): PROBNP in the last 8760 hours. ?HbA1C: ?No results for input(s): HGBA1C in the last 72 hours. ?CBG: ?No results for input(s): GLUCAP in the last 168 hours. ?Lipid Profile: ?No results for input(s): CHOL, HDL, LDLCALC, TRIG, CHOLHDL, LDLDIRECT in the last 72 hours. ?Thyroid Function Tests: ?Recent Labs  ?  10/11/21 ?1241  ?TSH 0.794  ? ?Anemia Panel: ?Recent Labs  ?  10/11/21 ?1241  ?FERRITIN 76  ?TIBC 234*  ?IRON 19*  ? ?Sepsis Labs: ?Recent Labs  ?Lab 10/11/21 ?1155  ?PROCALCITON <0.10  ? ? ?Recent Results (from the past 240 hour(s))  ?Resp Panel  by RT-PCR (Flu A&B, Covid) Nasopharyngeal Swab     Status: Abnormal  ? Collection Time: 10/11/21  1:01 PM  ? Specimen: Nasopharyngeal Swab; Nasopharyngeal(NP) swabs in vial transport medium  ?Result Value Ref Range Status  ? SARS Coronavirus 2 by RT PCR POSITIVE (A) NEGATIVE Final  ?  Comment: (NOTE) ?SARS-CoV-2 target nucleic acids are DETECTED. ? ?The SARS-CoV-2 RNA is generally detectable in upper respiratory ?specimens during the acute phase of infection. Positive results are ?indicative of the presence of the identified virus, but do not rule ?out bacterial infection or co-infection with other pathogens not ?detected by the test. Clinical correlation with patient history and ?other diagnostic information is necessary to determine patient ?infection status. The expected result is Negative. ? ?Fact Sheet for Patients: ?BloggerCourse.comhttps://www.fda.gov/media/152166/download ? ?Fact Sheet for Healthcare Providers: ?SeriousBroker.ithttps://www.fda.gov/media/152162/download ? ?This test is not yet approved or cleared by the [place] FDA and  ?has been authorized for detection and/or diagnosis of SARS-CoV-2 by ?FDA under an Emergency Use Authorization (EUA).  This EUA will ?remain in effect (meaning this test can be used) for the duration of  ?the COVID-19 declaration under Section 564(b)(1) of the A ct, 21 ?U.S.C. section 360bbb-3(b)(1), unless the authorization is ?terminated or revoked sooner. ? ?  ? Influenza A by PCR NEGATIVE NEGATIVE Final  ? Influenza B by PCR NEGATIVE NEGATIVE Final  ?  Comment: (NOTE) ?The Xpert Xpress SARS-CoV-2/FLU/RSV plus assay is intended as an aid ?in the diagnosis of influenza from Nasopharyngeal swab specimens and ?should not be used as a sole basis for treatment. Nasal washings and ?aspirates are unacceptable for Xpert Xpress SARS-CoV-2/FLU/RSV ?testing. ? ?Fact Sheet for Patients: ?BloggerCourse.comhttps://www.fda.gov/media/152166/download ? ?Fact Sheet for Healthcare  Providers: ?SeriousBroker.ithttps://www.fda.gov/media/152162/download ? ?This test is not yet approved or cleared by the [place] FDA and ?has been authorized for detection and/or diagnosis of SARS-CoV-2 by ?FDA under an Emergency Use Authorization (EUA). This EUA will remain ?in effect (meaning this test can be used) for the duration of the ?COVID-19 declaration under Section 564(b)(1) of the Act, 21 U.S.C. ?section 360bbb-3(b)(1), unless the authorization is terminated or ?revoked. ? ?Performed at [facility], [address] [place], ?[place] [address] ?  ?  ? ? ? ? ? ?Radiology Studies: ?DG Chest 2 View ? ?Result Date: 10/11/2021 ?CLINICAL DATA:  Cough and shortness of breath EXAM: CHEST - 2 VIEW COMPARISON:  06/23/2016 FINDINGS: Chronic cardiomegaly, aortic atherosclerosis and aortic tortuosity. Increased interstitial lung markings compared to the previous study raising the possibility of early pneumonia or interstitial edema. No pleural effusion. No acute bone finding. IMPRESSION: Chronic cardiomegaly, aortic atherosclerosis and aortic tortuosity. Increased interstitial lung markings which could reflect pneumonia or early interstitial edema. Electronically Signed   By: Paulina FusiMark  Shogry M.D.   On: 10/11/2021 12:36   ? ? ? ? ? ?  Scheduled Meds: ? amLODipine  2.5 mg Oral Daily  ? donepezil  5 mg Oral Daily  ? enoxaparin (LOVENOX) injection  40 mg Subcutaneous Q24H  ? fluticasone furoate-vilanterol  1 puff Inhalation Daily  ? furosemide  20 mg Intravenous BID  ? isosorbide mononitrate  30 mg Oral Daily  ? levothyroxine  50 mcg Oral Daily  ? nirmatrelvir/ritonavir EUA (renal dosing)  2 tablet Oral BID  ? potassium chloride  20 mEq Oral BID  ? sodium chloride flush  3 mL Intravenous Q12H  ? cyanocobalamin  1,000 mcg Oral Daily  ? ?Continuous Infusions: ? ? LOS: 1 day  ? ? ?Time spent: 30 mins ? ? ? ?Charise Killian, MD ?[facility] ?Pager [phone] ? ?If 7PM-7AM, please contact night-coverage ? ?10/12/2021, 7:56  AM  ? ?

## 2021-10-12 NOTE — ED Notes (Signed)
This RN at bedside to administer medication. Noted that pt's PIV was sitting on the bedside table. Pt also has removed puls ox and patient armband.  ?

## 2021-10-12 NOTE — ED Notes (Signed)
Pt's family at bedside taking pt's cane, clothing, and shoes home.  ?

## 2021-10-12 NOTE — Consult Note (Signed)
[facility] ? ?CARDIOLOGY CONSULT NOTE  ?Patient ID: ?Ann [name] ?MRN: [id number] ?DOB/AGE: 12-13-1919 88 y.o. ? ?Admit date: 10/11/2021 ?Referring Physician Clayton Lefort ?Primary Physician Albina Billet, MD ?Primary Cardiologist Andrez Grime, MD ?Reason for Consultation New heart failure, COVID ? ?HPI:  ?Ann [name] is a 88 year old female with history of mild aortic stenosis, moderate MR, hypertension, hyperlipidemia, COPD who presents to the emergency department with shortness of breath and was discovered to be COVID-positive. ? ?Interval history ?- Seems a little confused this morning.  ?- States she feels "pretty good" today.  ?- Denies shortness of breath or chest pain.  ? ?Review of systems complete and found to be negative unless listed above  ? ? ? ?Past Medical History:  ?Diagnosis Date  ? COPD (chronic obstructive pulmonary disease) ([place])   ? High cholesterol   ? Thyroid disease   ?  ?Past Surgical History:  ?Procedure Laterality Date  ? APPENDECTOMY    ? JOINT REPLACEMENT    ? THYROID SURGERY    ?  ?(Not in a hospital admission) ? ?Social History  ? ?Socioeconomic History  ? Marital status: Married  ?  Spouse name: Not on file  ? Number of children: Not on file  ? Years of education: Not on file  ? Highest education level: Not on file  ?Occupational History  ? Not on file  ?Tobacco Use  ? Smoking status: Never  ? Smokeless tobacco: Not on file  ?Substance and Sexual Activity  ? Alcohol use: Not on file  ? Drug use: Not on file  ? Sexual activity: Not on file  ?Other Topics Concern  ? Not on file  ?Social History Narrative  ? Not on file  ? ?Social Determinants of Health  ? ?Financial Resource Strain: Not on file  ?Food Insecurity: Not on file  ?Transportation Needs: Not on file  ?Physical Activity: Not on file  ?Stress: Not on file  ?Social Connections: Not on file  ?Intimate Partner Violence: Not on file  ?  ?No family history on file.  ? ? ?Review of systems complete and found to be  negative unless listed above  ? ? ? ? ?PHYSICAL EXAM ? ?General: Well developed, well nourished, in no acute distress ?HEENT:  Normocephalic and atramatic ?Neck:  No JVD.  ?Lungs: Clear bilaterally to auscultation and percussion. ?Heart: HRRR . 2/6 systolic murmur. Preserved S2 ?Abdomen: Bowel sounds are positive, abdomen soft and non-tender  ?Msk:  Back normal, normal gait. Normal strength and tone for age. ?Extremities: No clubbing, cyanosis or edema.   ?Neuro: Alert and oriented X 3. ?Psych:  Good affect, responds appropriately ? ?Labs: ?  ?Lab Results  ?Component Value Date  ? WBC 4.2 10/12/2021  ? HGB 10.9 (L) 10/12/2021  ? HCT 34.4 (L) 10/12/2021  ? MCV 95.8 10/12/2021  ? PLT 186 10/12/2021  ?  ?Recent Labs  ?Lab 10/11/21 ?1155 10/12/21 ?0646  ?NA 140 138  ?K 3.7 3.7  ?CL 107 102  ?CO2 25 27  ?BUN 22 18  ?CREATININE 0.58 0.60  ?CALCIUM 8.7* 8.5*  ?PROT 7.1  --   ?BILITOT 1.7*  --   ?ALKPHOS 77  --   ?ALT 9  --   ?AST 21  --   ?GLUCOSE 128* 94  ? ? ?Lab Results  ?Component Value Date  ? TROPONINI 0.04 (H) 07/15/2015  ? ?  ?Lab Results  ?Component Value Date  ? CHOL 177 08/25/2011  ? ?Lab Results  ?Component  Value Date  ? HDL 88 (H) 08/25/2011  ? ?Lab Results  ?Component Value Date  ? [place] 83 08/25/2011  ? ?Lab Results  ?Component Value Date  ? TRIG 31 08/25/2011  ? ?No results found for: CHOLHDL ?No results found for: LDLDIRECT  ?  ?Radiology: DG Chest 2 View ? ?Result Date: 10/11/2021 ?CLINICAL DATA:  Cough and shortness of breath EXAM: CHEST - 2 VIEW COMPARISON:  06/23/2016 FINDINGS: Chronic cardiomegaly, aortic atherosclerosis and aortic tortuosity. Increased interstitial lung markings compared to the previous study raising the possibility of early pneumonia or interstitial edema. No pleural effusion. No acute bone finding. IMPRESSION: Chronic cardiomegaly, aortic atherosclerosis and aortic tortuosity. Increased interstitial lung markings which could reflect pneumonia or early interstitial edema.  Electronically Signed   By: Nelson Chimes M.D.   On: 10/11/2021 12:36   ? ?EKG: Sinus rhythm with frequent PACs. Very irregular (? MAT). No acute ischemia  ? ?ASSESSMENT AND PLAN:  ?Ann [name] is a 88 year old female with history of mild aortic stenosis, moderate MR, hypertension, hyperlipidemia, COPD who presents to the emergency department with shortness of breath and was discovered to be COVID-positive. ? ?#COVID-pneumonia ?#Mild aortic stenosis ?#Possible new onset heart failure ?Patient is admitted with 1 week shortness of breath and URI symptoms and discovered to be COVID-positive.  In the setting her BNP is mildly elevated, though she does not appear grossly volume overloaded.. ?- Treat COVID as you are doing ?-Agree with attempted gentle diuresis with 20 mg of IV Lasix ?-Continue home imdur and aspirin ?- Agree with adding amlodipine 2.5 for hypertension ?- Echocardiogram complete ? ?# irregular heartbeat ?EKG is not consistent with atrial fibrillation, and rather shows sinus rhythm with frequent PACs or possibly MAT. ?-No indication for anticoagulation. ? ?Signed: ?Andrez Grime MD ?10/12/2021, 8:34 AM ? ?

## 2021-10-13 ENCOUNTER — Inpatient Hospital Stay
Admit: 2021-10-13 | Discharge: 2021-10-13 | Disposition: A | Discharge status: Home / Self Care | Payer: Medicare HMO | Attending: Family Medicine | Admitting: Family Medicine

## 2021-10-13 DIAGNOSIS — U071 COVID-19: Secondary | ICD-10-CM | POA: Diagnosis not present

## 2021-10-13 DIAGNOSIS — I509 Heart failure, unspecified: Secondary | ICD-10-CM | POA: Diagnosis not present

## 2021-10-13 LAB — ECHOCARDIOGRAM LIMITED
AR max vel: 2.57 cm2
AV Area VTI: 2.69 cm2
AV Area mean vel: 2.53 cm2
AV Mean grad: 10 mmHg
AV Peak grad: 18.5 mmHg
Ao pk vel: 2.15 m/s
Area-P 1/2: 5.2 cm2
Height: 67 in
P 1/2 time: 633 msec
S' Lateral: 3.4 cm
Weight: 2366.4 oz

## 2021-10-13 LAB — CBC
HCT: 37 % (ref 36.0–46.0)
Hemoglobin: 12 g/dL (ref 12.0–15.0)
MCH: 30.5 pg (ref 26.0–34.0)
MCHC: 32.4 g/dL (ref 30.0–36.0)
MCV: 93.9 fL (ref 80.0–100.0)
Platelets: 223 10*3/uL (ref 150–400)
RBC: 3.94 MIL/uL (ref 3.87–5.11)
RDW: 14.4 % (ref 11.5–15.5)
WBC: 3.4 10*3/uL — ABNORMAL LOW (ref 4.0–10.5)
nRBC: 0 % (ref 0.0–0.2)

## 2021-10-13 LAB — COMPREHENSIVE METABOLIC PANEL
ALT: 10 U/L (ref 0–44)
AST: 25 U/L (ref 15–41)
Albumin: 3.5 g/dL (ref 3.5–5.0)
Alkaline Phosphatase: 81 U/L (ref 38–126)
Anion gap: 8 (ref 5–15)
BUN: 18 mg/dL (ref 8–23)
CO2: 29 mmol/L (ref 22–32)
Calcium: 8.9 mg/dL (ref 8.9–10.3)
Chloride: 101 mmol/L (ref 98–111)
Creatinine, Ser: 0.69 mg/dL (ref 0.44–1.00)
GFR, Estimated: >60 mL/min (ref >60)
Glucose, Bld: 87 mg/dL (ref 70–99)
Potassium: 3.7 mmol/L (ref 3.5–5.1)
Sodium: 138 mmol/L (ref 135–145)
Total Bilirubin: 1.8 mg/dL — ABNORMAL HIGH (ref 0.3–1.2)
Total Protein: 7.1 g/dL (ref 6.5–8.1)

## 2021-10-13 LAB — VITAMIN B12: Vitamin B-12: 856 pg/mL (ref 180–914)

## 2021-10-13 MED ORDER — ADULT MULTIVITAMIN W/MINERALS CH
1.0000 | ORAL_TABLET | Freq: Every day | ORAL | Status: DC
  Administered 2021-10-13 – 2021-10-14 (×2): 1 via ORAL
  Filled 2021-10-13 (×2): qty 1

## 2021-10-13 MED ORDER — FUROSEMIDE 20 MG PO TABS
20.0000 mg | ORAL_TABLET | Freq: Every day | ORAL | Status: DC
  Administered 2021-10-13 – 2021-10-14 (×2): 20 mg via ORAL
  Filled 2021-10-13 (×2): qty 1

## 2021-10-13 NOTE — Progress Notes (Signed)
?PROGRESS NOTE ? ? ? ?Ann ChapelCorinne [name]  ZOX:[id number] DOB: 05/16/1920 DOA: 10/11/2021 ?PCP: Jaclyn Shaggyate, Denny C, MD  ? ?Assessment & Plan: ?  ?Principal Problem: ?  Acute CHF (congestive heart failure) (HCC) ?Active Problems: ?  Aortic valve disease ?  Benign essential hypertension ?  Hypothyroid ?  Hyperlipidemia ?  Arrhythmia ?  COVID-19 virus infection ?  Elevated troponin ?  Hyperbilirubinemia ? ?Assessment and Plan: ?Acute CHF: unknown systolic vs diastolic vs mixed. Echo pending. Continue on lasix, imdur, & statin. Monitor I/Os. Cardio recs apprec ? ?Hyperbilirubinemia: etiology unclear. Will continue to monitor  ?  ?Elevated troponins: likely secondary to demand ischemia ?  ?COVID-19 pneumonia: continue on renally dose of paxlovid x 5 days. Continue on airborne & contact precautions ?  ?Arrhythmia: possibly MAT as per cardio  ?  ?Hypothyroidism: continue on levothyroxine  ? ?WJX:[id number]:continue on amlodipine, imdur  ? ?Dementia: continue on home dose of donepezil  ? ?Asthma: unknown stage and/or severity. Continue on bronchodilators   ? ? ? ?DVT prophylaxis: lovenox  ?Code Status: full  ?Family Communication: called pt's daughter, Tyler AasDoris, no answer but I left a voicemail  ?Disposition Plan:  depends on PT/OT recs  ?  ?Level of care: Telemetry Cardiac ? ?Status is: Inpatient ?Remains inpatient appropriate because: severity of illness  ? ? ? ?Consultants:  ?Cardio  ? ?Procedures:  ? ?Antimicrobials:  ? ? ?Subjective: ?Pt c/o malaise  ? ?Objective: ?Vitals:  ? 10/12/21 2019 10/12/21 2318 10/13/21 [id number] 10/13/21 0719  ?BP: (!) 128/91 (!) 147/100 (!) 154/105   ?Pulse: 75 85 72   ?Resp: 19 (!) 24 17   ?Temp: 98.6 ?F (37 ?C) 98.2 ?F (36.8 ?C) 98.2 ?F (36.8 ?C)   ?TempSrc:      ?SpO2: 97% 98% 95%   ?Weight:    67.1 kg  ?Height:      ? ? ?Intake/Output Summary (Last 24 hours) at 10/13/2021 0742 ?Last data filed at 10/13/2021 0600 ?Gross per 24 hour  ?Intake 3 ml  ?Output 550 ml  ?Net -547 ml  ? ?Filed Weights  ? 10/11/21 1138 10/13/21 0719   ?Weight: 72.6 kg 67.1 kg  ? ? ?Examination: ? ?General exam: Appears calm & comfortable  ?Respiratory system: decreased breath sounds b/l  ?Cardiovascular system: S1/S2+. No rubs or gallops  ?Gastrointestinal system: Abd is soft, NT, ND & hypoactive bowel sounds  ?Central nervous system: Alert and oriented. Moves all extremities  ?Psychiatry: Judgement and insight appears poor. Flat mood and affect  ? ? ? ?Data Reviewed: I have personally reviewed following labs and imaging studies ? ?CBC: ?Recent Labs  ?Lab 10/11/21 ?1155 10/12/21 ?0646 10/13/21 ?0602  ?WBC 4.1 4.2 3.4*  ?NEUTROABS 2.9  --   --   ?HGB 11.1* 10.9* 12.0  ?HCT 35.3* 34.4* 37.0  ?MCV 97.0 95.8 93.9  ?PLT 220 186 223  ? ?Basic Metabolic Panel: ?Recent Labs  ?Lab 10/11/21 ?1155 10/12/21 ?0646 10/13/21 ?0602  ?NA 140 138 138  ?K 3.7 3.7 3.7  ?CL 107 102 101  ?CO2 25 27 29   ?GLUCOSE 128* 94 87  ?BUN 22 18 18   ?CREATININE 0.58 0.60 0.69  ?CALCIUM 8.7* 8.5* 8.9  ? ?GFR: ?Estimated Creatinine Clearance: 37.3 mL/min (by C-G formula based on SCr of 0.69 mg/dL). ?Liver Function Tests: ?Recent Labs  ?Lab 10/11/21 ?1155 10/13/21 ?0602  ?AST 21 25  ?ALT 9 10  ?ALKPHOS 77 81  ?BILITOT 1.7* 1.8*  ?PROT 7.1 7.1  ?ALBUMIN 3.7 3.5  ? ?No results  for input(s): LIPASE, AMYLASE in the last 168 hours. ?No results for input(s): AMMONIA in the last 168 hours. ?Coagulation Profile: ?No results for input(s): INR, PROTIME in the last 168 hours. ?Cardiac Enzymes: ?No results for input(s): CKTOTAL, CKMB, CKMBINDEX, TROPONINI in the last 168 hours. ?BNP (last 3 results) ?No results for input(s): PROBNP in the last 8760 hours. ?HbA1C: ?No results for input(s): HGBA1C in the last 72 hours. ?CBG: ?No results for input(s): GLUCAP in the last 168 hours. ?Lipid Profile: ?No results for input(s): CHOL, HDL, LDLCALC, TRIG, CHOLHDL, LDLDIRECT in the last 72 hours. ?Thyroid Function Tests: ?Recent Labs  ?  10/11/21 ?1241  ?TSH 0.794  ? ?Anemia Panel: ?Recent Labs  ?  10/11/21 ?1241  10/12/21 ?1903  ?[id number]  --  856  ?FERRITIN 76  --   ?TIBC 234*  --   ?IRON 19*  --   ? ?Sepsis Labs: ?Recent Labs  ?Lab 10/11/21 ?1155  ?PROCALCITON <0.10  ? ? ?Recent Results (from the past 240 hour(s))  ?Resp Panel by RT-PCR (Flu A&B, Covid) Nasopharyngeal Swab     Status: Abnormal  ? Collection Time: 10/11/21  1:01 PM  ? Specimen: Nasopharyngeal Swab; Nasopharyngeal(NP) swabs in vial transport medium  ?Result Value Ref Range Status  ? SARS Coronavirus 2 by RT PCR POSITIVE (A) NEGATIVE Final  ?  Comment: (NOTE) ?SARS-CoV-2 target nucleic acids are DETECTED. ? ?The SARS-CoV-2 RNA is generally detectable in upper respiratory ?specimens during the acute phase of infection. Positive results are ?indicative of the presence of the identified virus, but do not rule ?out bacterial infection or co-infection with other pathogens not ?detected by the test. Clinical correlation with patient history and ?other diagnostic information is necessary to determine patient ?infection status. The expected result is Negative. ? ?Fact Sheet for Patients: ?BloggerCourse.com ? ?Fact Sheet for Healthcare Providers: ?SeriousBroker.it ? ?This test is not yet approved or cleared by the [place] FDA and  ?has been authorized for detection and/or diagnosis of SARS-CoV-2 by ?FDA under an Emergency Use Authorization (EUA).  This EUA will ?remain in effect (meaning this test can be used) for the duration of  ?the COVID-19 declaration under Section 564(b)(1) of the A ct, 21 ?U.S.C. section 360bbb-3(b)(1), unless the authorization is ?terminated or revoked sooner. ? ?  ? Influenza A by PCR NEGATIVE NEGATIVE Final  ? Influenza B by PCR NEGATIVE NEGATIVE Final  ?  Comment: (NOTE) ?The Xpert Xpress SARS-CoV-2/FLU/RSV plus assay is intended as an aid ?in the diagnosis of influenza from Nasopharyngeal swab specimens and ?should not be used as a sole basis for treatment. Nasal washings  and ?aspirates are unacceptable for Xpert Xpress SARS-CoV-2/FLU/RSV ?testing. ? ?Fact Sheet for Patients: ?BloggerCourse.com ? ?Fact Sheet for Healthcare Providers: ?SeriousBroker.it ? ?This test is not yet approved or cleared by the [place] FDA and ?has been authorized for detection and/or diagnosis of SARS-CoV-2 by ?FDA under an Emergency Use Authorization (EUA). This EUA will remain ?in effect (meaning this test can be used) for the duration of the ?COVID-19 declaration under Section 564(b)(1) of the Act, 21 U.S.C. ?section 360bbb-3(b)(1), unless the authorization is terminated or ?revoked. ? ?Performed at [facility], [address] [place], ?[place] [address] ?  ?  ? ? ? ? ? ?Radiology Studies: ?DG Chest 2 View ? ?Result Date: 10/11/2021 ?CLINICAL DATA:  Cough and shortness of breath EXAM: CHEST - 2 VIEW COMPARISON:  06/23/2016 FINDINGS: Chronic cardiomegaly, aortic atherosclerosis and aortic tortuosity. Increased interstitial lung markings compared  to the previous study raising the possibility of early pneumonia or interstitial edema. No pleural effusion. No acute bone finding. IMPRESSION: Chronic cardiomegaly, aortic atherosclerosis and aortic tortuosity. Increased interstitial lung markings which could reflect pneumonia or early interstitial edema. Electronically Signed   By: Paulina Fusi M.D.   On: 10/11/2021 12:36   ? ? ? ? ? ?Scheduled Meds: ? amLODipine  2.5 mg Oral Daily  ? donepezil  5 mg Oral Daily  ? enoxaparin (LOVENOX) injection  40 mg Subcutaneous Q24H  ? fluticasone furoate-vilanterol  1 puff Inhalation Daily  ? furosemide  20 mg Intravenous BID  ? isosorbide mononitrate  30 mg Oral Daily  ? levothyroxine  50 mcg Oral Daily  ? nirmatrelvir/ritonavir EUA (renal dosing)  2 tablet Oral BID  ? potassium chloride  20 mEq Oral BID  ? sodium chloride flush  3 mL Intravenous Q12H  ? cyanocobalamin  1,000 mcg Oral Daily  ? ?Continuous  Infusions: ? ? LOS: 2 days  ? ? ?Time spent: 25 mins ? ? ? ?Charise Killian, MD ?[facility] ?Pager [phone] ? ?If 7PM-7AM, please contact night-coverage ? ?10/13/2021, 7:42 AM  ? ?

## 2021-10-13 NOTE — Progress Notes (Signed)
CSW lvm with patient's daughter Ann [name] to determine home situation and inquire of family can provide 24/7 supervision/assistance per PT/OT recs with HH.  ? ?Pending call back.  ? Angeline Slim, [place] ?[phone] ? ?

## 2021-10-13 NOTE — Progress Notes (Addendum)
Initial Nutrition Assessment ? ?DOCUMENTATION CODES:  ? ?Not applicable ? ?INTERVENTION:  ? ?-Liberalize diet to 2 gram sodium for wider variety of meal selections ?-Ice cream with all meals ?-MVI with minerals daily ? ?NUTRITION DIAGNOSIS:  ? ?Increased nutrient needs related to acute illness (COVID-19) as evidenced by estimated needs. ? ?GOAL:  ? ?Patient will meet greater than or equal to 90% of their needs ? ?MONITOR:  ? ?PO intake, Supplement acceptance ? ?REASON FOR ASSESSMENT:  ? ?Malnutrition Screening Tool ?  ? ?ASSESSMENT:  ? ?Pt with medical history significant for hypertension, hyperlipidemia, mild aortic stenosis, who presents with shortness of breath. ? ?Pt admitted with CHF and COVID-19 pneumonia.  ? ?Reviewed I/O's: -547 ml x 24 hours and -2.2 L since admission ? ?UOP: 550 ml x 24 hours ? ?Spoke with pt and daughter at bedside. Pt reports good appetite and consumed all of her breakfast this morning. Pt shares that she generally consumes 2 meals per day and also grazes on snacks such as ice cream at home. Pt shares that she enjoys going out to eat on occasion and her granddaughters like to treat her to [facility]. She denies any changes in taste or smell.  ? ?Pt denies any weight loss. Reviewed wt hx; noted distant history of weight loss. Daughter confirms no recent wt loss. Suspect fat and muscle depletions are related to advanced age.  ? ?Discussed importance of good meal intake to promote healing.   ? ?Medications reviewed and include lasix, potassium chloride, and vitamin B-12.  ? ?Labs reviewed.  ? ?NUTRITION - FOCUSED PHYSICAL EXAM: ? ?Flowsheet Row Most Recent Value  ?Orbital Region No depletion  ?Upper Arm Region Mild depletion  ?Thoracic and Lumbar Region No depletion  ?Buccal Region No depletion  ?Temple Region Mild depletion  ?Clavicle Bone Region Moderate depletion  ?Clavicle and Acromion Bone Region Moderate depletion  ?Scapular Bone Region Moderate depletion  ?Dorsal Hand Mild depletion   ?Patellar Region No depletion  ?Anterior Thigh Region No depletion  ?Posterior Calf Region No depletion  ?Edema (RD Assessment) Mild  ?Hair Reviewed  ?Eyes Reviewed  ?Mouth Reviewed  ?Skin Reviewed  ?Nails Reviewed  ? ?  ? ? ?Diet Order:   ?Diet Order   ? ?       ?  Diet 2 gram sodium Room service appropriate? Yes; Fluid consistency: Thin  Diet effective now       ?  ? ?  ?  ? ?  ? ? ?EDUCATION NEEDS:  ? ?Education needs have been addressed ? ?Skin:  Skin Assessment: Reviewed RN Assessment ? ?Last BM:  Unknown ? ?Height:  ? ?Ht Readings from Last 1 Encounters:  ?10/11/21 5\' 7"  (1.702 m)  ? ? ?Weight:  ? ?Wt Readings from Last 1 Encounters:  ?10/13/21 67.1 kg  ? ? ?Ideal Body Weight:  61.4 kg ? ?BMI:  Body mass index is 23.16 kg/m?. ? ?Estimated Nutritional Needs:  ? ?Kcal:  1650-1850 ? ?Protein:  80-95 grams ? ?Fluid:  > 1.6 L ? ? ? ?Loistine Chance, RD, LDN, CDCES ?Registered Dietitian II ?Certified Diabetes Care and Education Specialist ?Please refer to [facility] for RD and/or RD on-call/weekend/after hours pager  ?

## 2021-10-13 NOTE — Evaluation (Signed)
Occupational Therapy Evaluation ?Patient Details ?Name: Ann [name] ?MRN: [id number] ?DOB: [birth year] ?Today's Date: 10/13/2021 ? ? ?History of Present Illness Ann [name] is a 88 y.o. female with medical history significant for hypertension, hyperlipidemia, mild aortic stenosis, who presents with shortness of breath concerning for acute CHF and possibly new atrial fibrillation as well as being found to be COVID-positive.  ? ?Clinical Impression ?  ?Ms Greff was seen for OT evaluation this date. Pt oriented to self only - states location as on a plane. Pt presents to acute OT demonstrating impaired ADL performance and functional mobility 2/2 decreased activity tolerance and functional strength/ROM/balance deficits. Pt currently requires CGA + RW for toilet t/f, pericare standing, and hand washing standing sink side. MIN A standing from low commode and bed height improving to CGA from BSC/chair height. Pt would benefit from skilled OT to address noted impairments and functional limitations (see below for any additional details). Upon hospital discharge, recommend HHOT if pt can have initial 24/7 SUPERVISION, if unable then pt appropriate for STR. ?  ? ?Recommendations for follow up therapy are one component of a multi-disciplinary discharge planning process, led by the attending physician.  Recommendations may be updated based on patient status, additional functional criteria and insurance authorization.  ? ?Follow Up Recommendations ? Home health OT  ?  ?Assistance Recommended at Discharge Frequent or constant Supervision/Assistance (if pt unable to have SUPERVISION at home then pt is appopriate for STR)  ?Patient can return home with the following A little help with walking and/or transfers;A little help with bathing/dressing/bathroom;Help with stairs or ramp for entrance ? ?  ?Functional Status Assessment ? Patient has had a recent decline in their functional status and demonstrates the ability to make  significant improvements in function in a reasonable and predictable amount of time.  ?Equipment Recommendations ? BSC/3in1  ?  ?Recommendations for Other Services   ? ? ?  ?Precautions / Restrictions Precautions ?Precautions: Fall ?Restrictions ?Weight Bearing Restrictions: No  ? ?  ? ?Mobility Bed Mobility ?Overal bed mobility: Needs Assistance ?Bed Mobility: Supine to Sit ?  ?  ?Supine to sit: Min assist ?  ?  ?General bed mobility comments: assist to initiate ?  ? ?Transfers ?Overall transfer level: Needs assistance ?Equipment used: Rolling [place] (2 wheels) ?Transfers: Sit to/from Stand ?Sit to Stand: Min guard ?  ?  ?  ?  ?  ?General transfer comment: MIN A from low toilet improving to CGA from BSC/chair height ?  ? ?  ?Balance Overall balance assessment: Needs assistance ?Sitting-balance support: No upper extremity supported, Feet supported ?Sitting balance-Leahy Scale: Good ?  ?  ?Standing balance support: No upper extremity supported, During functional activity ?Standing balance-Leahy Scale: Fair ?  ?  ?  ?  ?  ?  ?  ?  ?  ?  ?  ?  ?   ? ?ADL either performed or assessed with clinical judgement  ? ?ADL Overall ADL's : Needs assistance/impaired ?  ?  ?  ?  ?  ?  ?  ?  ?  ?  ?  ?  ?  ?  ?  ?  ?  ?  ?  ?General ADL Comments: CGA + RW for toilet t/f, pericare standing, and hand washing standing sink side. MOD I don/doff B socks seated EOB  ? ? ? ? ?Pertinent Vitals/Pain Pain Assessment ?Pain Assessment: No/denies pain  ? ? ? ?Hand Dominance   ?  ?Extremity/Trunk Assessment Upper Extremity Assessment ?  Upper Extremity Assessment: Overall WFL for tasks assessed ?  ?Lower Extremity Assessment ?Lower Extremity Assessment: Generalized weakness ?  ?  ?  ?Communication Communication ?Communication: HOH ?  ?Cognition Arousal/Alertness: Awake/alert ?Behavior During Therapy: [facility] for tasks assessed/performed ?Overall Cognitive Status: No family/caregiver present to determine baseline cognitive functioning ?Area of  Impairment: Orientation, Following commands, Memory ?  ?  ?  ?  ?  ?  ?  ?  ?Orientation Level: Disoriented to, Place, Situation, Time ?  ?Memory: Decreased recall of precautions, Decreased short-term memory ?Following Commands: Follows one step commands with increased time ?  ?  ?  ?  ?  ?  ?General Comments  mid 90s on RA during mobility ? ?  ?   ?   ? ? ?Home Living Family/patient expects to be discharged to:: Private residence ?  ?  ?  ?  ?  ?  ?  ?  ?  ?  ?  ?  ?  ?  ?  ?  ?  ?  ? ?  ?Prior Functioning/Environment Prior Level of Function : Patient poor historian/Family not available ?  ?  ?  ?  ?  ?  ?  ?  ?  ? ?  ?  ?OT Problem List: Decreased strength;Decreased activity tolerance;Impaired balance (sitting and/or standing);Decreased safety awareness ?  ?   ?OT Treatment/Interventions: Self-care/ADL training;Therapeutic exercise;Energy conservation;DME and/or AE instruction;Therapeutic activities;Balance training;Patient/family education  ?  ?OT Goals(Current goals can be found in the care plan section) Acute Rehab OT Goals ?Patient Stated Goal: to go home ?OT Goal Formulation: With patient ?Time For Goal Achievement: 10/27/21 ?Potential to Achieve Goals: Good ?ADL Goals ?Pt Will Perform Grooming: with modified independence;standing ?Pt Will Perform Lower Body Dressing: with modified independence;sit to/from stand ?Pt Will Transfer to Toilet: with modified independence;ambulating;regular height toilet  ?OT Frequency: Min 2X/week ?  ? ?Co-evaluation   ?  ?  ?  ?  ? ?  ?AM-PAC OT "6 Clicks" Daily Activity     ?Outcome Measure Help from another person eating meals?: None ?Help from another person taking care of personal grooming?: A Little ?Help from another person toileting, which includes using toliet, bedpan, or urinal?: A Little ?Help from another person bathing (including washing, rinsing, drying)?: A Little ?Help from another person to put on and taking off regular upper body clothing?: None ?Help from  another person to put on and taking off regular lower body clothing?: None ?6 Click Score: 21 ?  ?End of Session Equipment Utilized During Treatment: Rolling [place] (2 wheels) ?Nurse Communication: Mobility status ? ?Activity Tolerance: Patient tolerated treatment well ?Patient left: in chair;with call bell/phone within reach;with chair alarm set ? ?OT Visit Diagnosis: Other abnormalities of gait and mobility (R26.89);Muscle weakness (generalized) (M62.81)  ?              ?Time: 1610-9604 ?OT Time Calculation (min): 35 min ?Charges:  OT General Charges ?$OT Visit: 1 Visit ?OT Evaluation ?$OT Eval Moderate Complexity: 1 Mod ?OT Treatments ?$Self Care/Home Management : 23-37 mins ? ?Kathie Dike, M.S. OTR/L  ?10/13/21, 12:46 PM  ?ascom [phone] ? ?

## 2021-10-13 NOTE — Progress Notes (Signed)
*  PRELIMINARY RESULTS* ?Echocardiogram ?2D Echocardiogram has been performed. ? ?Briann Sarchet, Dorene Sorrow ?10/13/2021, 8:49 AM ?

## 2021-10-13 NOTE — Evaluation (Signed)
Physical Therapy Evaluation ?Patient Details ?Name: Ann [name] ?MRN: [id number] ?DOB: 05-May-1920 ?Today's Date: 10/13/2021 ? ?History of Present Illness ? Ann [name] is a 88 y.o. female with medical history significant for hypertension, hyperlipidemia, mild aortic stenosis, who presents with shortness of breath concerning for acute CHF and possibly new atrial fibrillation as well as being found to be COVID-positive. ?  ?Clinical Impression ? Patient alert, agreeable to PT, oriented to self only. Denied any pain, pt poor historian, unclear PLOF but pt endorsed using a [place]. ? ?Pt up in recliner at start of session, able to  move all extemities against gravity. Sit <> stand with minA, and then with CGA (extended time, pt gathered momentum, use of chair arms). She ambulated ~38ft, and then an additional 50ft, RW and CGA. No LOB noted, but pt did have increased work of breathing, but spO2 92% or greater.  Overall the patient demonstrated deficits (see "PT Problem List") that impede the patient's functional abilities, safety, and mobility and would benefit from skilled PT intervention. Recommendation is HHPT with constant supervision/assistance to maximize function in safety. If pt is unable to have adequate support at home, may benefit from transition to SNF.   ?   ? ?Recommendations for follow up therapy are one component of a multi-disciplinary discharge planning process, led by the attending physician.  Recommendations may be updated based on patient status, additional functional criteria and insurance authorization. ? ?Follow Up Recommendations Home health PT ? ?  ?Assistance Recommended at Discharge Frequent or constant Supervision/Assistance  ?Patient can return home with the following ?   ? ?  ?Equipment Recommendations Rolling [place] (2 wheels)  ?Recommendations for Other Services ?    ?  ?Functional Status Assessment Patient has had a recent decline in their functional status and demonstrates the ability  to make significant improvements in function in a reasonable and predictable amount of time.  ? ?  ?Precautions / Restrictions Precautions ?Precautions: Fall ?Restrictions ?Weight Bearing Restrictions: No  ? ?  ? ?Mobility ? Bed Mobility ?  ?  ?  ?  ?  ?  ?  ?General bed mobility comments: pt up in recliner at start/end of session ?  ? ?Transfers ?Overall transfer level: Needs assistance ?Equipment used: Rolling [place] (2 wheels) ?Transfers: Sit to/from Stand ?Sit to Stand: Min guard, Min assist ?  ?  ?  ?  ?  ?General transfer comment: first attempt with minA, second attempt with extra time and pt gathered momentum to complete with CGA ?  ? ?Ambulation/Gait ?  ?Gait Distance (Feet): 27 Feet (19ft, and then 21ft) ?Assistive device: Rolling [place] (2 wheels) ?  ?Gait velocity: decreased ?  ?  ?General Gait Details: some increased work of breathing with ambulation, but spO2 92% or greater on room air. decreased step height/length ? ?Stairs ?  ?  ?  ?  ?  ? ?Wheelchair Mobility ?  ? ?Modified Rankin (Stroke Patients Only) ?  ? ?  ? ?Balance Overall balance assessment: Needs assistance ?Sitting-balance support: No upper extremity supported, Feet supported ?Sitting balance-Leahy Scale: Good ?  ?  ?Standing balance support: No upper extremity supported, During functional activity ?Standing balance-Leahy Scale: Fair ?  ?  ?  ?  ?  ?  ?  ?  ?  ?  ?  ?  ?   ? ? ? ?Pertinent Vitals/Pain Pain Assessment ?Pain Assessment: No/denies pain  ? ? ?Home Living Family/patient expects to be discharged to:: Private residence ?  ?  ?  ?  ?  ?  ?  ?  ?  ?   ?  ?  Prior Function Prior Level of Function : Patient poor historian/Family not available ?  ?  ?  ?  ?  ?  ?  ?  ?  ? ? ?Hand Dominance  ?   ? ?  ?Extremity/Trunk Assessment  ? Upper Extremity Assessment ?Upper Extremity Assessment: Overall WFL for tasks assessed ?  ? ?Lower Extremity Assessment ?Lower Extremity Assessment: Generalized weakness ?  ? ?   ?Communication  ? Communication:  HOH  ?Cognition Arousal/Alertness: Awake/alert ?Behavior During Therapy: [facility] for tasks assessed/performed ?Overall Cognitive Status: No family/caregiver present to determine baseline cognitive functioning ?Area of Impairment: Orientation, Following commands, Memory ?  ?  ?  ?  ?  ?  ?  ?  ?Orientation Level: Disoriented to, Place, Situation, Time ?  ?Memory: Decreased recall of precautions, Decreased short-term memory ?Following Commands: Follows one step commands with increased time ?  ?  ?  ?  ?  ?  ? ?  ?General Comments General comments (skin integrity, edema, etc.): mid 90s on RA during mobility ? ?  ?Exercises    ? ?Assessment/Plan  ?  ?PT Assessment Patient needs continued PT services  ?PT Problem List Decreased strength;Decreased mobility;Decreased activity tolerance;Decreased balance ? ?   ?  ?PT Treatment Interventions DME instruction;Therapeutic exercise;Gait training;Balance training;Stair training;Neuromuscular re-education;Functional mobility training;Therapeutic activities;Patient/family education   ? ?PT Goals (Current goals can be found in the Care Plan section)  ?Acute Rehab PT Goals ?PT Goal Formulation: Patient unable to participate in goal setting ?Time For Goal Achievement: 10/27/21 ?Potential to Achieve Goals: Fair ? ?  ?Frequency Min 2X/week ?  ? ? ?Co-evaluation   ?  ?  ?  ?  ? ? ?  ?AM-PAC PT "6 Clicks" Mobility  ?Outcome Measure Help needed turning from your back to your side while in a flat bed without using bedrails?: None ?Help needed moving from lying on your back to sitting on the side of a flat bed without using bedrails?: None ?Help needed moving to and from a bed to a chair (including a wheelchair)?: A Little ?Help needed standing up from a chair using your arms (e.g., wheelchair or bedside chair)?: A Little ?Help needed to walk in hospital room?: A Little ?Help needed climbing 3-5 steps with a railing? : A Lot ?6 Click Score: 19 ? ?  ?End of Session Equipment Utilized During  Treatment: Gait belt ?Activity Tolerance: Patient tolerated treatment well ?Patient left: in chair;with chair alarm set;with call bell/phone within reach ?Nurse Communication: Mobility status ?PT Visit Diagnosis: Other abnormalities of gait and mobility (R26.89);Difficulty in walking, not elsewhere classified (R26.2);Muscle weakness (generalized) (M62.81) ?  ? ?Time: 9244-6286 ?PT Time Calculation (min) (ACUTE ONLY): 21 min ? ? ?Charges:   PT Evaluation ?$PT Eval Low Complexity: 1 Low ?PT Treatments ?$Therapeutic Activity: 8-22 mins ?  ?   ? ?Olga Coaster PT, DPT ?1:22 PM,10/13/21 ? ?

## 2021-10-13 NOTE — Consult Note (Signed)
[facility] ? ?CARDIOLOGY CONSULT NOTE  ?Patient ID: ?Ann [name] ?MRN: [id number] ?DOB/AGE: 86-Feb-1921 86 y.o. ? ?Admit date: 10/11/2021 ?Referring Physician Merian Capron ?Primary Physician Jaclyn Shaggy, MD ?Primary Cardiologist Armando Reichert, MD ?Reason for Consultation New heart failure, COVID ? ?HPI:  ?Ann [name] is a 88 year old female with history of mild aortic stenosis, moderate MR, hypertension, hyperlipidemia, COPD who presents to the emergency department with shortness of breath and was discovered to be COVID-positive. ? ?Interval history ?- Feels well this morning.  ?- Denies SOB or chest pain ? ?Review of systems complete and found to be negative unless listed above  ? ? ? ?Past Medical History:  ?Diagnosis Date  ? COPD (chronic obstructive pulmonary disease) (HCC)   ? High cholesterol   ? Thyroid disease   ?  ?Past Surgical History:  ?Procedure Laterality Date  ? APPENDECTOMY    ? JOINT REPLACEMENT    ? THYROID SURGERY    ?  ?Medications Prior to Admission  ?Medication Sig Dispense Refill Last Dose  ? BREO ELLIPTA 100-25 MCG/ACT AEPB 1 puff daily.   10/11/2021 at 1100  ? isosorbide mononitrate (IMDUR) 30 MG 24 hr tablet Take 30 mg by mouth daily.   10/11/2021 at 0800  ? levothyroxine (SYNTHROID, LEVOTHROID) 50 MCG tablet Take 50 mcg by mouth daily.   10/11/2021 at 0800  ? acetaminophen (TYLENOL) 325 MG tablet Take 650 mg by mouth every 6 (six) hours as needed for headache. For pain   prn at prn  ? albuterol (PROVENTIL HFA;VENTOLIN HFA) 108 (90 Base) MCG/ACT inhaler Inhale 2 puffs into the lungs every 6 (six) hours as needed for wheezing or shortness of breath. Dispense 1 spacer to use with the inhaler 1 Inhaler 2 prn at prn  ? amLODipine (NORVASC) 2.5 MG tablet Take 2.5 mg by mouth daily. (Patient not taking: Reported on 10/12/2021)   Not Taking  ? cyanocobalamin 1000 MCG tablet Take by mouth. (Patient not taking: Reported on 10/12/2021)   Not Taking  ? donepezil (ARICEPT) 5 MG tablet Take 5 mg by  mouth daily. (Patient not taking: Reported on 10/12/2021)   Not Taking  ? losartan (COZAAR) 50 MG tablet Take 50 mg by mouth daily.     ? ? ?Social History  ? ?Socioeconomic History  ? Marital status: Married  ?  Spouse name: Not on file  ? Number of children: Not on file  ? Years of education: Not on file  ? Highest education level: Not on file  ?Occupational History  ? Not on file  ?Tobacco Use  ? Smoking status: Never  ? Smokeless tobacco: Not on file  ?Substance and Sexual Activity  ? Alcohol use: Not on file  ? Drug use: Not on file  ? Sexual activity: Not on file  ?Other Topics Concern  ? Not on file  ?Social History Narrative  ? Not on file  ? ?Social Determinants of Health  ? ?Financial Resource Strain: Not on file  ?Food Insecurity: Not on file  ?Transportation Needs: Not on file  ?Physical Activity: Not on file  ?Stress: Not on file  ?Social Connections: Not on file  ?Intimate Partner Violence: Not on file  ?  ?No family history on file.  ? ? ?Review of systems complete and found to be negative unless listed above  ? ? ? ? ?PHYSICAL EXAM ? ?General: Well developed, well nourished, in no acute distress ?HEENT:  Normocephalic and atramatic ?Neck:  No JVD.  ?Lungs: Clear  bilaterally to auscultation and percussion. ?Heart: HRRR . 2/6 systolic murmur. Preserved S2 ?Abdomen: Bowel sounds are positive, abdomen soft and non-tender  ?Msk:  Back normal, normal gait. Normal strength and tone for age. ?Extremities: No clubbing, cyanosis or edema.   ?Neuro: Alert and oriented X 3. ?Psych:  Good affect, responds appropriately ? ?Labs: ?  ?Lab Results  ?Component Value Date  ? WBC 3.4 (L) 10/13/2021  ? HGB 12.0 10/13/2021  ? HCT 37.0 10/13/2021  ? MCV 93.9 10/13/2021  ? PLT 223 10/13/2021  ?  ?Recent Labs  ?Lab 10/13/21 ?0602  ?NA 138  ?K 3.7  ?CL 101  ?CO2 29  ?BUN 18  ?CREATININE 0.69  ?CALCIUM 8.9  ?PROT 7.1  ?BILITOT 1.8*  ?ALKPHOS 81  ?ALT 10  ?AST 25  ?GLUCOSE 87  ? ? ?Lab Results  ?Component Value Date  ? TROPONINI  0.04 (H) 07/15/2015  ? ?  ?Lab Results  ?Component Value Date  ? CHOL 177 08/25/2011  ? ?Lab Results  ?Component Value Date  ? HDL 88 (H) 08/25/2011  ? ?Lab Results  ?Component Value Date  ? LDLCALC 83 08/25/2011  ? ?Lab Results  ?Component Value Date  ? TRIG 31 08/25/2011  ? ?No results found for: CHOLHDL ?No results found for: LDLDIRECT  ?  ?Radiology: DG Chest 2 View ? ?Result Date: 10/11/2021 ?CLINICAL DATA:  Cough and shortness of breath EXAM: CHEST - 2 VIEW COMPARISON:  06/23/2016 FINDINGS: Chronic cardiomegaly, aortic atherosclerosis and aortic tortuosity. Increased interstitial lung markings compared to the previous study raising the possibility of early pneumonia or interstitial edema. No pleural effusion. No acute bone finding. IMPRESSION: Chronic cardiomegaly, aortic atherosclerosis and aortic tortuosity. Increased interstitial lung markings which could reflect pneumonia or early interstitial edema. Electronically Signed   By: Paulina Fusi M.D.   On: 10/11/2021 12:36   ? ?EKG: Sinus rhythm with frequent PACs. Very irregular (? MAT). No acute ischemia  ? ?ASSESSMENT AND PLAN:  ?Ann [name] is a 88 year old female with history of mild aortic stenosis, moderate MR, hypertension, hyperlipidemia, COPD who presents to the emergency department with shortness of breath and was discovered to be COVID-positive. ? ?#COVID-pneumonia ?#Mild aortic stenosis, mild/moderate AI ?#HFpEF ?Patient is admitted with 1 week shortness of breath and URI symptoms and discovered to be COVID-positive.  In the setting her BNP is mildly elevated, though she does not appear grossly volume overloaded.. ?- Treat COVID as you are doing ?- s/p 2 days of lasix 20 mg IV BID ?- Start lasix 20 mg PO daily ?-Continue home imdur and aspirin ?- Agree with adding amlodipine 2.5 for hypertension ?- Echocardiogram- completed this AM. Will be formally reviewed later. Mild to moderate AI, mild AS. Normal Ef.  ? ?Safe for DC from cardiac perspective.  Please schedule f/u with me in 2 weeks.  ? ?# irregular heartbeat ?EKG is not consistent with atrial fibrillation, and rather shows sinus rhythm with frequent PACs or possibly MAT. ?-No indication for anticoagulation. ? ?Signed: ?Armando Reichert MD ?10/13/2021, 9:11 AM ? ?

## 2021-10-14 DIAGNOSIS — F0390 Unspecified dementia without behavioral disturbance: Secondary | ICD-10-CM | POA: Diagnosis not present

## 2021-10-14 DIAGNOSIS — U071 COVID-19: Secondary | ICD-10-CM | POA: Diagnosis not present

## 2021-10-14 DIAGNOSIS — I10 Essential (primary) hypertension: Secondary | ICD-10-CM | POA: Diagnosis not present

## 2021-10-14 LAB — COMPREHENSIVE METABOLIC PANEL
ALT: 16 U/L (ref 0–44)
AST: 34 U/L (ref 15–41)
Albumin: 3.6 g/dL (ref 3.5–5.0)
Alkaline Phosphatase: 89 U/L (ref 38–126)
Anion gap: 7 (ref 5–15)
BUN: 22 mg/dL (ref 8–23)
CO2: 26 mmol/L (ref 22–32)
Calcium: 8.9 mg/dL (ref 8.9–10.3)
Chloride: 101 mmol/L (ref 98–111)
Creatinine, Ser: 0.66 mg/dL (ref 0.44–1.00)
GFR, Estimated: >60 mL/min (ref >60)
Glucose, Bld: 107 mg/dL — ABNORMAL HIGH (ref 70–99)
Potassium: 4.1 mmol/L (ref 3.5–5.1)
Sodium: 134 mmol/L — ABNORMAL LOW (ref 135–145)
Total Bilirubin: 1.5 mg/dL — ABNORMAL HIGH (ref 0.3–1.2)
Total Protein: 7.1 g/dL (ref 6.5–8.1)

## 2021-10-14 LAB — CBC
HCT: 37.1 % (ref 36.0–46.0)
Hemoglobin: 12.2 g/dL (ref 12.0–15.0)
MCH: 30.6 pg (ref 26.0–34.0)
MCHC: 32.9 g/dL (ref 30.0–36.0)
MCV: 93 fL (ref 80.0–100.0)
Platelets: 240 10*3/uL (ref 150–400)
RBC: 3.99 MIL/uL (ref 3.87–5.11)
RDW: 14.5 % (ref 11.5–15.5)
WBC: 5.5 10*3/uL (ref 4.0–10.5)
nRBC: 0 % (ref 0.0–0.2)

## 2021-10-14 MED ORDER — NIRMATRELVIR/RITONAVIR (PAXLOVID) TABLET (RENAL DOSING): 2.0000 | ORAL_TABLET | Freq: Two times a day (BID) | ORAL | 0 refills | Status: AC

## 2021-10-14 MED ORDER — FUROSEMIDE 20 MG PO TABS: 20.0000 mg | ORAL_TABLET | Freq: Every day | ORAL | 0 refills | Status: DC

## 2021-10-14 NOTE — TOC Initial Note (Signed)
Transition of Care (TOC) - Initial/Assessment Note  ? ? ?Patient Details  ?Name: Ann [name] ?MRN: [id number] ?Date of Birth: 1919-11-11 ? ?Transition of Care (TOC) CM/SW Contact:    ?Gildardo Griffes, LCSW ?Phone Number: ?10/14/2021, 9:08 AM ? ?Clinical Narrative:                 ? ?CSW spoke with patient's daughter doris who reports patient lives with her in which daughter provides 24/7 supervision and assistance to patient, reports they have also hired aids as needed. Is in agreement with home health PT and RN.  ? ?CSW has set patient up with [facility] PT and RN through Delila Spence with [facility] has accepted referral.  ? ?No other needs at this time, patient's daughter Tyler Aas to transport home at time of discharge.  ? ?Expected Discharge Plan: Home w Home Health Services ?Barriers to Discharge: Continued Medical Work up ? ? ?Patient Goals and CMS Choice ?Patient states their goals for this hospitalization and ongoing recovery are:: to go home ?CMS Medicare.gov Compare Post Acute Care list provided to:: Patient Represenative (must comment) (daughter doris) ?Choice offered to / list presented to : Adult Children ? ?Expected Discharge Plan and Services ?Expected Discharge Plan: Home w Home Health Services ?  ?  ?  ?Living arrangements for the past 2 months: Single Family Home ?                ?  ?  ?  ?  ?  ?HH Arranged: PT, RN ?HH Agency: [facility] ?Date HH Agency Contacted: 10/14/21 ?Time HH Agency Contacted: [phone] ?Representative spoke with at [facility] Agency: Kandee Keen ? ?Prior Living Arrangements/Services ?Living arrangements for the past 2 months: Single Family Home ?Lives with:: Adult Children ?  ?Do you feel safe going back to the place where you live?: Yes      ?  ?  ?  ?  ? ?Activities of Daily Living ?  ?  ? ?Permission Sought/Granted ?  ?  ?   ?   ?   ?   ? ?Emotional Assessment ?  ?  ?  ?  ?  ?  ? ?Admission diagnosis:  SOB (shortness of breath) [R06.02] ?Acute CHF (congestive heart failure) (HCC)  [I50.9] ?Elevated brain natriuretic peptide (BNP) level [R79.89] ?Troponin I above reference range [R77.8] ?Acute congestive heart failure, unspecified heart failure type (HCC) [I50.9] ?COVID [U07.1] ?Patient Active Problem List  ? Diagnosis Date Noted  ? Acute CHF (congestive heart failure) (HCC) 10/11/2021  ? Arrhythmia 10/11/2021  ? COVID-19 virus infection 10/11/2021  ? Elevated troponin 10/11/2021  ? Hyperbilirubinemia 10/11/2021  ? Aortic valve disease 01/30/2019  ? Asymmetric septal hypertrophy (HCC) 08/30/2017  ? Benign essential hypertension 08/07/2015  ? Hypothyroid 08/24/2013  ? Hyperlipidemia 08/24/2013  ? ?PCP:  Jaclyn Shaggy, MD ?Pharmacy:   ?[facility] - [place], [place] - [phone] ?[phone] ?[place] [address] ?Phone: [phone] Fax: [phone] ? ? ? ? ?Social Determinants of Health (SDOH) Interventions ?  ? ?Readmission Risk Interventions ?   ? View : No data to display.  ?  ?  ?  ? ? ? ?

## 2021-10-14 NOTE — Discharge Summary (Signed)
Physician Discharge Summary  Ann [name] FAO:[id number] DOB: September 27, 1919 DOA: 10/11/2021  PCP: Jaclyn Shaggy, MD  Admit date: 10/11/2021 Discharge date: 10/14/2021  Admitted From: home  Disposition:  home w/ home health   Recommendations for Outpatient Follow-up:  Follow up with PCP in 1-2 weeks F/u w/ cardio, Dr. Beatrix Fetters, in 1-2 weeks  Home Health: yes Equipment/Devices:  Discharge Condition: stable  CODE STATUS: full  Diet recommendation: Heart Healthy  Brief/Interim Summary: HPI was taken from Dr. Crissie Reese: Ann [name] is a 88 y.o. female with medical history significant for hypertension, hyperlipidemia, mild aortic stenosis, who presents with shortness of breath.   Patient presents with family for symptom of shortness of breath for about a week.  Family also notes that she has had some congestion over this time as well.  Denies having any periods of chest pain or palpitations, denies any lower extremity edema.   In the ED vital signs notable only for mild tachypnea in the low 20s.  CBC showed only mild normocytic anemia with hemoglobin of 11.1, CMP was unremarkable with exception of total bilirubin which was 1.7, up from 0.6 on last check.  BNP was elevated at 394, was 92 on last check 6 years prior.  Troponin was also mildly elevated on first check at 30, trend is pending.  Chest x-ray was obtained which showed interstitial markings consistent with edema versus pneumonia.  EKG showed possible new atrial fibrillation as well as diffuse T wave flattening most pronounced in the inferior lateral leads.  Respiratory panel came back negative for influenza but positive for COVID. Procalcitonin returned negative.  She was given full dose aspirin, 20 mg IV Lasix, and admitted for further management.  Hospital course as per Dr. Mayford Knife 5/7-10/14/21: Pt presented w/ shortness of breath likely secondary COVID19 pneumonia. Pt did not require supplemental oxygen while inpatient. Pt was treated w/  reduced dose of paxlovid x 5 days. Of note, pt was also initially thought to have CHF exacerbation and was placed on lasix. Echo was done which showed EF 65-70%, normal diastolic function, no regional wall motion abnormalities, mod MR, mild-mod AR. Pt was d/c home po lasix as per cardio & will f/u w/ cardio in 1-2 weeks.   Discharge Diagnoses:  Principal Problem:   Acute CHF (congestive heart failure) (HCC) Active Problems:   Aortic valve disease   Benign essential hypertension   Hypothyroid   Hyperlipidemia   Arrhythmia   COVID-19 virus infection   Elevated troponin   Hyperbilirubinemia  R/o acute CHF: echo shows EF 65-70%, normal diastolic function & no regional wall motion abnormalities. Continue on lasix, imdur, & statin. Monitor I/Os. Cardio recs apprec  Hyperbilirubinemia: etiology unclear, labile    Elevated troponins: likely secondary to demand ischemia   COVID-19 pneumonia: continue on renally dose of paxlovid x 5 days. Continue on airborne & contact precautions   Arrhythmia: possibly MAT as per cardio    Hypothyroidism: continue on levothyroxine   ONG:[id number] on amlodipine, imdur   Dementia: continue on home dose of donepezil   Asthma: unknown stage and/or severity. Continue on bronchodilators    Discharge Instructions  Discharge Instructions     Diet - low sodium heart healthy   Complete by: As directed    Discharge instructions   Complete by: As directed    F/u w/ PCP in 1-2 weeks. F/u w/ cardio, Dr. Beatrix Fetters, in 2 weeks   Increase activity slowly   Complete by: As directed  Allergies as of 10/14/2021       Reactions   Penicillins         Medication List     TAKE these medications    acetaminophen 325 MG tablet Commonly known as: TYLENOL Take 650 mg by mouth every 6 (six) hours as needed for headache. For pain   albuterol 108 (90 Base) MCG/ACT inhaler Commonly known as: VENTOLIN HFA Inhale 2 puffs into the lungs every 6 (six) hours as  needed for wheezing or shortness of breath. Dispense 1 spacer to use with the inhaler   amLODipine 2.5 MG tablet Commonly known as: NORVASC Take 2.5 mg by mouth daily.   Breo Ellipta 100-25 MCG/ACT Aepb Generic drug: fluticasone furoate-vilanterol 1 puff daily.   cyanocobalamin 1000 MCG tablet Take by mouth.   donepezil 5 MG tablet Commonly known as: ARICEPT Take 5 mg by mouth daily.   furosemide 20 MG tablet Commonly known as: LASIX Take 1 tablet (20 mg total) by mouth daily. Start taking on: Oct 15, 2021   isosorbide mononitrate 30 MG 24 hr tablet Commonly known as: IMDUR Take 30 mg by mouth daily.   levothyroxine 50 MCG tablet Commonly known as: SYNTHROID Take 50 mcg by mouth daily.   losartan 50 MG tablet Commonly known as: COZAAR Take 50 mg by mouth daily.   nirmatrelvir/ritonavir EUA (renal dosing) 10 x 150 MG & 10 x 100MG  Tabs Commonly known as: PAXLOVID Take 2 tablets by mouth 2 (two) times daily for 5 days. Take nirmatrelvir (150 mg) one tablet twice daily for 5 days and ritonavir (100 mg) one tablet twice daily for 5 days.               Durable Medical Equipment  (From admission, onward)           Start     Ordered   10/13/21 2049  For home use only DME Zayed rolling  Once       Question Answer Comment  Hitch: With 5 Inch Wheels   Patient needs a Cordle to treat with the following condition Difficulty walking      10/13/21 2049            Follow-up Information     Armando Reichert, MD. Go in 1 week(s).   Specialty: Cardiology Contact information: [address] [place] [address] [phone]                Allergies  Allergen Reactions   Penicillins     Consultations: cardio   Procedures/Studies: DG Chest 2 View  Result Date: 10/11/2021 CLINICAL DATA:  Cough and shortness of breath EXAM: CHEST - 2 VIEW COMPARISON:  06/23/2016 FINDINGS: Chronic cardiomegaly, aortic atherosclerosis and aortic  tortuosity. Increased interstitial lung markings compared to the previous study raising the possibility of early pneumonia or interstitial edema. No pleural effusion. No acute bone finding. IMPRESSION: Chronic cardiomegaly, aortic atherosclerosis and aortic tortuosity. Increased interstitial lung markings which could reflect pneumonia or early interstitial edema. Electronically Signed   By: Paulina Fusi M.D.   On: 10/11/2021 12:36   ECHOCARDIOGRAM LIMITED  Result Date: 10/13/2021    ECHOCARDIOGRAM LIMITED REPORT   Patient Name:   Ann [name] Date of Exam: 10/13/2021 Medical Rec #:  [id number]      Height:       67.0 in Accession #:    [id number]     Weight:       147.9 lb Date of Birth:  [birth year]  BSA:          1.779 m Patient Age:    88 years       BP:           154/105 mmHg Patient Gender: F              HR:           72 bpm. Exam Location:  [facility] Procedure: Limited Echo, Color Doppler and Cardiac Doppler Indications:     CHF-acute systolic I50.21  History:         Patient has no prior history of Echocardiogram examinations.                  COPD. High cholesterol.  Sonographer:     Cristela Blue Referring Phys:  [id number] MATTHEW M ECKSTAT Diagnosing Phys: Arnoldo Hooker MD IMPRESSIONS  1. Left ventricular ejection fraction, by estimation, is 65 to 70%. The left ventricle has normal function. The left ventricle has no regional wall motion abnormalities. There is mild left ventricular hypertrophy. Left ventricular diastolic parameters were normal.  2. Right ventricular systolic function is normal. The right ventricular size is normal.  3. Left atrial size was mildly dilated.  4. The mitral valve is normal in structure. Moderate mitral valve regurgitation.  5. The aortic valve is tricuspid. Aortic valve regurgitation is mild to moderate. FINDINGS  Left Ventricle: Left ventricular ejection fraction, by estimation, is 65 to 70%. The left ventricle has normal function. The left ventricle has no regional wall motion  abnormalities. There is mild left ventricular hypertrophy. Left ventricular diastolic  parameters were normal. Right Ventricle: The right ventricular size is normal. No increase in right ventricular wall thickness. Right ventricular systolic function is normal. Left Atrium: Left atrial size was mildly dilated. Right Atrium: Right atrial size was normal in size. Pericardium: Trivial pericardial effusion is present. Mitral Valve: The mitral valve is normal in structure. Moderate mitral valve regurgitation. Tricuspid Valve: The tricuspid valve is normal in structure. Tricuspid valve regurgitation is mild. Aortic Valve: The aortic valve is tricuspid. Aortic valve regurgitation is mild to moderate. Aortic regurgitation PHT measures 633 msec. Aortic valve mean gradient measures 10.0 mmHg. Aortic valve peak gradient measures 18.5 mmHg. Aortic valve area, by VTI measures 2.69 cm. Pulmonic Valve: The pulmonic valve was normal in structure. Pulmonic valve regurgitation is trivial. Aorta: The aortic root and ascending aorta are structurally normal, with no evidence of dilitation. IAS/Shunts: No atrial level shunt detected by color flow Doppler. LEFT VENTRICLE PLAX 2D LVIDd:         4.70 cm   Diastology LVIDs:         3.40 cm   LV e' medial:    4.57 cm/s LV PW:         1.40 cm   LV E/e' medial:  12.8 LV IVS:        1.80 cm   LV e' lateral:   3.59 cm/s LVOT diam:     2.00 cm   LV E/e' lateral: 16.2 LV SV:         103 LV SV Index:   58 LVOT Area:     3.14 cm  RIGHT VENTRICLE RV S prime:     6.74 cm/s TAPSE (M-mode): 1.5 cm LEFT ATRIUM             Index        RIGHT ATRIUM          Index LA diam:  4.10 cm 2.30 cm/m   RA Area:     7.04 cm LA Vol (A2C):   59.0 ml 33.17 ml/m  RA Volume:   9.32 ml  5.24 ml/m LA Vol (A4C):   56.5 ml 31.76 ml/m LA Biplane Vol: 61.3 ml 34.46 ml/m  AORTIC VALVE AV Area (Vmax):    2.57 cm AV Area (Vmean):   2.53 cm AV Area (VTI):     2.69 cm AV Vmax:           215.00 cm/s AV Vmean:           144.000 cm/s AV VTI:            0.383 m AV Peak Grad:      18.5 mmHg AV Mean Grad:      10.0 mmHg LVOT Vmax:         176.00 cm/s LVOT Vmean:        116.000 cm/s LVOT VTI:          0.328 m LVOT/AV VTI ratio: 0.86 AI PHT:            633 msec  AORTA Ao Root diam: 4.13 cm MITRAL VALVE               TRICUSPID VALVE MV Area (PHT): 5.20 cm    TR Peak grad:   26.8 mmHg MV Decel Time: 146 msec    TR Vmax:        259.00 cm/s MV E velocity: 58.30 cm/s MV A velocity: 80.10 cm/s  SHUNTS MV E/A ratio:  0.73        Systemic VTI:  0.33 m                            Systemic Diam: 2.00 cm Arnoldo Hooker MD Electronically signed by Arnoldo Hooker MD Signature Date/Time: 10/13/2021/12:11:47 PM    Final    (Echo, Carotid, EGD, Colonoscopy, ERCP)    Subjective: Pt c/o fatigue    Discharge Exam: Vitals:   10/14/21 0949 10/14/21 1156  BP: (!) 145/85 112/76  Pulse: 64 66  Resp: 18   Temp: 97.9 F (36.6 C)   SpO2: 98% 98%   Vitals:   10/14/21 0508 10/14/21 0650 10/14/21 0949 10/14/21 1156  BP: (!) 153/90  (!) 145/85 112/76  Pulse: 72  64 66  Resp: 20  18   Temp: 98.1 F (36.7 C)  97.9 F (36.6 C)   TempSrc:      SpO2: 97%  98% 98%  Weight:  66.5 kg    Height:        General: Pt is alert, awake, not in acute distress Cardiovascular: S1/S2 +, no rubs, no gallops Respiratory: decreased breath sounds b/l otherwise clear  Abdominal: Soft, NT, ND, bowel sounds + Extremities:  no cyanosis    The results of significant diagnostics from this hospitalization (including imaging, microbiology, ancillary and laboratory) are listed below for reference.     Microbiology: Recent Results (from the past 240 hour(s))  Resp Panel by RT-PCR (Flu A&B, Covid) Nasopharyngeal Swab     Status: Abnormal   Collection Time: 10/11/21  1:01 PM   Specimen: Nasopharyngeal Swab; Nasopharyngeal(NP) swabs in vial transport medium  Result Value Ref Range Status   SARS Coronavirus 2 by RT PCR POSITIVE (A) NEGATIVE Final     Comment: (NOTE) SARS-CoV-2 target nucleic acids are DETECTED.  The SARS-CoV-2 RNA is generally detectable in upper respiratory specimens during  the acute phase of infection. Positive results are indicative of the presence of the identified virus, but do not rule out bacterial infection or co-infection with other pathogens not detected by the test. Clinical correlation with patient history and other diagnostic information is necessary to determine patient infection status. The expected result is Negative.  Fact Sheet for Patients: BloggerCourse.com  Fact Sheet for Healthcare Providers: SeriousBroker.it  This test is not yet approved or cleared by the [place] FDA and  has been authorized for detection and/or diagnosis of SARS-CoV-2 by FDA under an Emergency Use Authorization (EUA).  This EUA will remain in effect (meaning this test can be used) for the duration of  the COVID-19 declaration under Section 564(b)(1) of the A ct, 21 U.S.C. section 360bbb-3(b)(1), unless the authorization is terminated or revoked sooner.     Influenza A by PCR NEGATIVE NEGATIVE Final   Influenza B by PCR NEGATIVE NEGATIVE Final    Comment: (NOTE) The Xpert Xpress SARS-CoV-2/FLU/RSV plus assay is intended as an aid in the diagnosis of influenza from Nasopharyngeal swab specimens and should not be used as a sole basis for treatment. Nasal washings and aspirates are unacceptable for Xpert Xpress SARS-CoV-2/FLU/RSV testing.  Fact Sheet for Patients: BloggerCourse.com  Fact Sheet for Healthcare Providers: SeriousBroker.it  This test is not yet approved or cleared by the [place] FDA and has been authorized for detection and/or diagnosis of SARS-CoV-2 by FDA under an Emergency Use Authorization (EUA). This EUA will remain in effect (meaning this test can be used) for the duration of  the COVID-19 declaration under Section 564(b)(1) of the Act, 21 U.S.C. section 360bbb-3(b)(1), unless the authorization is terminated or revoked.  Performed at [facility], [address] [place], [place] [address]      Labs: BNP (last 3 results) Recent Labs    10/11/21 1155  BNP 394.5*   Basic Metabolic Panel: Recent Labs  Lab 10/11/21 1155 10/12/21 0646 10/13/21 0602 10/14/21 0620  NA 140 138 138 134*  K 3.7 3.7 3.7 4.1  CL 107 102 101 101  CO2 25 27 29 26   GLUCOSE 128* 94 87 107*  BUN 22 18 18 22   CREATININE 0.58 0.60 0.69 0.66  CALCIUM 8.7* 8.5* 8.9 8.9   Liver Function Tests: Recent Labs  Lab 10/11/21 1155 10/13/21 0602 10/14/21 0620  AST 21 25 34  ALT 9 10 16   ALKPHOS 77 81 89  BILITOT 1.7* 1.8* 1.5*  PROT 7.1 7.1 7.1  ALBUMIN 3.7 3.5 3.6   No results for input(s): LIPASE, AMYLASE in the last 168 hours. No results for input(s): AMMONIA in the last 168 hours. CBC: Recent Labs  Lab 10/11/21 1155 10/12/21 0646 10/13/21 0602 10/14/21 0620  WBC 4.1 4.2 3.4* 5.5  NEUTROABS 2.9  --   --   --   HGB 11.1* 10.9* 12.0 12.2  HCT 35.3* 34.4* 37.0 37.1  MCV 97.0 95.8 93.9 93.0  PLT 220 186 223 240   Cardiac Enzymes: No results for input(s): CKTOTAL, CKMB, CKMBINDEX, TROPONINI in the last 168 hours. BNP: Invalid input(s): POCBNP CBG: No results for input(s): GLUCAP in the last 168 hours. D-Dimer No results for input(s): DDIMER in the last 72 hours. Hgb A1c No results for input(s): HGBA1C in the last 72 hours. Lipid Profile No results for input(s): CHOL, HDL, LDLCALC, TRIG, CHOLHDL, LDLDIRECT in the last 72 hours. Thyroid function studies Recent Labs    10/11/21 1241  TSH 0.794   Anemia work up  Recent Labs    10/11/21 1241 10/12/21 1903  VITAMINB12  --  856  FERRITIN 76  --   TIBC 234*  --   IRON 19*  --    Urinalysis    Component Value Date/Time   COLORURINE Yellow 08/10/2013 2028   APPEARANCEUR Hazy 08/10/2013 2028    LABSPEC 1.016 08/10/2013 2028   PHURINE 5.0 08/10/2013 2028   GLUCOSEU Negative 08/10/2013 2028   HGBUR 3+ 08/10/2013 2028   BILIRUBINUR Negative 08/10/2013 2028   KETONESUR Negative 08/10/2013 2028   PROTEINUR Negative 08/10/2013 2028   NITRITE Negative 08/10/2013 2028   LEUKOCYTESUR 3+ 08/10/2013 2028   Sepsis Labs Invalid input(s): PROCALCITONIN,  WBC,  LACTICIDVEN Microbiology Recent Results (from the past 240 hour(s))  Resp Panel by RT-PCR (Flu A&B, Covid) Nasopharyngeal Swab     Status: Abnormal   Collection Time: 10/11/21  1:01 PM   Specimen: Nasopharyngeal Swab; Nasopharyngeal(NP) swabs in vial transport medium  Result Value Ref Range Status   SARS Coronavirus 2 by RT PCR POSITIVE (A) NEGATIVE Final    Comment: (NOTE) SARS-CoV-2 target nucleic acids are DETECTED.  The SARS-CoV-2 RNA is generally detectable in upper respiratory specimens during the acute phase of infection. Positive results are indicative of the presence of the identified virus, but do not rule out bacterial infection or co-infection with other pathogens not detected by the test. Clinical correlation with patient history and other diagnostic information is necessary to determine patient infection status. The expected result is Negative.  Fact Sheet for Patients: BloggerCourse.com  Fact Sheet for Healthcare Providers: SeriousBroker.it  This test is not yet approved or cleared by the [place] FDA and  has been authorized for detection and/or diagnosis of SARS-CoV-2 by FDA under an Emergency Use Authorization (EUA).  This EUA will remain in effect (meaning this test can be used) for the duration of  the COVID-19 declaration under Section 564(b)(1) of the A ct, 21 U.S.C. section 360bbb-3(b)(1), unless the authorization is terminated or revoked sooner.     Influenza A by PCR NEGATIVE NEGATIVE Final   Influenza B by PCR NEGATIVE NEGATIVE Final     Comment: (NOTE) The Xpert Xpress SARS-CoV-2/FLU/RSV plus assay is intended as an aid in the diagnosis of influenza from Nasopharyngeal swab specimens and should not be used as a sole basis for treatment. Nasal washings and aspirates are unacceptable for Xpert Xpress SARS-CoV-2/FLU/RSV testing.  Fact Sheet for Patients: BloggerCourse.com  Fact Sheet for Healthcare Providers: SeriousBroker.it  This test is not yet approved or cleared by the [place] FDA and has been authorized for detection and/or diagnosis of SARS-CoV-2 by FDA under an Emergency Use Authorization (EUA). This EUA will remain in effect (meaning this test can be used) for the duration of the COVID-19 declaration under Section 564(b)(1) of the Act, 21 U.S.C. section 360bbb-3(b)(1), unless the authorization is terminated or revoked.  Performed at [facility], [address], [place], [place] [address]      Time coordinating discharge: Over 30 minutes  SIGNED:   Charise Killian, MD  [facility] 10/14/2021, 12:39 PM Pager   If 7PM-7AM, please contact night-coverage

## 2021-10-21 NOTE — Progress Notes (Signed)
Patient ID: Ann [name], female    DOB: 07/03/1919, 88 y.o.   MRN: [id number]  HPI  Ann [name] is a 88 y/o female with a history of hyperlipidemia, HTN, thyroid disease, aortic stenosis, COPD, dementia and chronic heart failure.   Echo report from 10/13/21 reviewed and showed an EF of 65-70% along with mild LVH/ LAE, moderate MR and mild/moderate AR.   Admitted 10/11/21 due to shortness of breath. Initially given IV lasix with transition to oral diuretics. Found to be covid +. Cardiology consult obtained. Received 5 days of reduced paxlovid. Elevated troponin thought to be due to demand ischemia. Discharged after 3 days.   She presents today for her initial visit with a chief complaint of minimal fatigue upon moderate exertion. Describes this as chronic in nature. She has associated shortness of breath, pedal edema, constipation and left sided neck pain along with this. She denies any cough, chest pain, palpitations, abdominal distention, dizziness, difficulty sleeping or weight gain.   Daughter says that she feels like patient isn't drinking enough fluids during the day. Weighing daily.   Past Medical History:  Diagnosis Date   Aortic stenosis    CHF (congestive heart failure) (HCC)    COPD (chronic obstructive pulmonary disease) (HCC)    Dementia (HCC)    High cholesterol    Hypertension    Thyroid disease    Past Surgical History:  Procedure Laterality Date   APPENDECTOMY     JOINT REPLACEMENT     THYROID SURGERY     History reviewed. No pertinent family history. Social History   Tobacco Use   Smoking status: Never   Smokeless tobacco: Not on file  Substance Use Topics   Alcohol use: Not on file   Allergies  Allergen Reactions   Penicillins    Prior to Admission medications   Medication Sig Start Date End Date Taking? Authorizing Provider  acetaminophen (TYLENOL) 325 MG tablet Take 650 mg by mouth every 6 (six) hours as needed for headache. For pain   Yes [provider]  albuterol (PROVENTIL HFA;VENTOLIN HFA) 108 (90 Base) MCG/ACT inhaler Inhale 2 puffs into the lungs every 6 (six) hours as needed for wheezing or shortness of breath. Dispense 1 spacer to use with the inhaler 07/15/15  Yes Arnaldo NatalMalinda, Paul F, MD  amLODipine (NORVASC) 2.5 MG tablet Take 2.5 mg by mouth daily. 07/01/21  Yes [provider]  BREO ELLIPTA 100-25 MCG/ACT AEPB 1 puff daily. 09/24/21  Yes [provider]  cyanocobalamin 1000 MCG tablet Take by mouth.   Yes [provider]  donepezil (ARICEPT) 5 MG tablet Take 5 mg by mouth daily. 04/24/21  Yes [provider]  furosemide (LASIX) 20 MG tablet Take 1 tablet (20 mg total) by mouth daily. 10/15/21 11/14/21 Yes Charise KillianWilliams, Jamiese M, MD  isosorbide mononitrate (IMDUR) 30 MG 24 hr tablet Take 30 mg by mouth daily. 05/08/21  Yes [provider]  levothyroxine (SYNTHROID, LEVOTHROID) 50 MCG tablet Take 50 mcg by mouth daily.   Yes [provider]  losartan (COZAAR) 50 MG tablet Take 50 mg by mouth daily. 08/07/15  Yes [provider]   Review of Systems  Constitutional:  Positive for fatigue. Negative for appetite change.  HENT:  Negative for congestion, postnasal drip and sore throat.   Eyes: Negative.   Respiratory:  Positive for shortness of breath. Negative for cough.   Cardiovascular:  Positive for leg swelling (occasionally). Negative for chest pain and palpitations.  Gastrointestinal:  Positive for constipation (at times). Negative for abdominal distention and abdominal pain.  Endocrine: Negative.   Genitourinary: Negative.   Musculoskeletal:  Positive for neck pain (left side of neck). Negative for back pain.  Skin: Negative.   Allergic/Immunologic: Negative.   Neurological:  Negative for dizziness and light-headedness.  Hematological:  Negative for adenopathy. Bruises/bleeds easily.  Psychiatric/Behavioral:  Negative for dysphoric mood and sleep disturbance (sleeping  on 1 pillow). The patient is not nervous/anxious.    Vitals:   10/22/21 1430  BP: (!) 143/93  Pulse: 66  Resp: 16  SpO2: 96%  Weight: 154 lb 8 oz (70.1 kg)  Height: 5\' 1"  (1.549 m)   Wt Readings from Last 3 Encounters:  10/22/21 154 lb 8 oz (70.1 kg)  10/14/21 146 lb 8 oz (66.5 kg)  08/20/15 205 lb (93 kg)   Lab Results  Component Value Date   CREATININE 0.66 10/14/2021   CREATININE 0.69 10/13/2021   CREATININE 0.60 10/12/2021   Physical Exam Vitals and nursing note reviewed. Exam conducted with a chaperone present (daughter).  Constitutional:      Appearance: Normal appearance.  HENT:     Head: Normocephalic and atraumatic.  Cardiovascular:     Rate and Rhythm: Normal rate and regular rhythm.  Pulmonary:     Effort: Pulmonary effort is normal. No respiratory distress.     Breath sounds: No wheezing or rales.  Abdominal:     General: There is no distension.     Palpations: Abdomen is soft.     Tenderness: There is no abdominal tenderness.  Musculoskeletal:        General: No tenderness.     Cervical back: Normal range of motion and neck supple.     Right lower leg: Edema (trace pitting) present.     Left lower leg: Edema (trace pitting) present.  Skin:    General: Skin is warm and dry.  Neurological:     General: No focal deficit present.     Mental Status: She is alert and oriented to person, place, and time.  Psychiatric:        Mood and Affect: Mood normal.        Behavior: Behavior normal.        Thought Content: Thought content normal.   Assessment & Plan:  1: Chronic heart failure preserved ejection fraction with structural changes (LVH/LAE)- - NYHA class II - euvolemic today - weighing daily and understands to call for an overnight weight gain of > 2 pounds or a weekly weight gain of >5 pounds - not adding much salt to her food; encouraged her to not add any salt to her food; daughter is trying to be mindful of sodium content of foods - saw cardiology  12/12/2021) 07/29/21 - daughter says that patient doesn't drink much fluids during the day but they can't quantify amount; reviewed the importance of drinking between 60-64 ounces of fluids daily - BNP 10/11/21 was 394.5  2: HTN- - BP mildly elevated (143/93) - saw PCP 12/11/21)  - BMP 10/14/21 reviewed and showed sodium 134, potassium 4.1, creatinine 0.66 and GFR >60  3: COPD- - using albuterol PRN and breo daily  4: Dementia- - lives with daughter - + short-term memory loss - taking donepezil daily   Patient did not bring her medications nor a list. Each medication was verbally reviewed with the patient and she was encouraged to bring the bottles to every visit to confirm accuracy of list.    Return  in 6 weeks, sooner if needed.

## 2021-10-22 ENCOUNTER — Encounter: Payer: Self-pay | Admitting: Family

## 2021-10-22 ENCOUNTER — Ambulatory Visit: Payer: Medicare HMO | Attending: Family | Admitting: Family

## 2021-10-22 VITALS — BP 143/93 | HR 66 | Resp 16 | Ht 61.0 in | Wt 154.5 lb

## 2021-10-22 DIAGNOSIS — F0390 Unspecified dementia without behavioral disturbance: Secondary | ICD-10-CM | POA: Insufficient documentation

## 2021-10-22 DIAGNOSIS — I5032 Chronic diastolic (congestive) heart failure: Secondary | ICD-10-CM | POA: Diagnosis present

## 2021-10-22 DIAGNOSIS — M542 Cervicalgia: Secondary | ICD-10-CM | POA: Diagnosis not present

## 2021-10-22 DIAGNOSIS — E785 Hyperlipidemia, unspecified: Secondary | ICD-10-CM | POA: Insufficient documentation

## 2021-10-22 DIAGNOSIS — F03A0 Unspecified dementia, mild, without behavioral disturbance, psychotic disturbance, mood disturbance, and anxiety: Secondary | ICD-10-CM

## 2021-10-22 DIAGNOSIS — I110 Hypertensive heart disease with heart failure: Secondary | ICD-10-CM | POA: Diagnosis present

## 2021-10-22 DIAGNOSIS — I10 Essential (primary) hypertension: Secondary | ICD-10-CM

## 2021-10-22 DIAGNOSIS — J449 Chronic obstructive pulmonary disease, unspecified: Secondary | ICD-10-CM | POA: Diagnosis not present

## 2021-10-22 DIAGNOSIS — I350 Nonrheumatic aortic (valve) stenosis: Secondary | ICD-10-CM | POA: Insufficient documentation

## 2021-10-22 DIAGNOSIS — E079 Disorder of thyroid, unspecified: Secondary | ICD-10-CM | POA: Diagnosis not present

## 2021-10-22 DIAGNOSIS — Z79899 Other long term (current) drug therapy: Secondary | ICD-10-CM | POA: Diagnosis not present

## 2021-10-22 DIAGNOSIS — K5900 Constipation, unspecified: Secondary | ICD-10-CM | POA: Diagnosis not present

## 2021-10-22 NOTE — Patient Instructions (Addendum)
Continue weighing daily and call for an overnight weight gain of 3 pounds or more or a weekly weight gain of more than 5 pounds.   If you have voicemail, please make sure your mailbox is cleaned out so that we may leave a message and please make sure to listen to any voicemails.    Drink 60-64 ounces of fluids daily 

## 2021-10-23 ENCOUNTER — Encounter: Payer: Self-pay | Admitting: Family

## 2021-10-28 ENCOUNTER — Other Ambulatory Visit
Admission: RE | Admit: 2021-10-28 | Discharge: 2021-10-28 | Disposition: A | Discharge status: Home / Self Care | Payer: Medicare HMO | Source: Ambulatory Visit | Attending: Cardiology | Admitting: Cardiology

## 2021-10-28 DIAGNOSIS — I422 Other hypertrophic cardiomyopathy: Secondary | ICD-10-CM | POA: Diagnosis present

## 2021-10-28 DIAGNOSIS — I10 Essential (primary) hypertension: Secondary | ICD-10-CM | POA: Insufficient documentation

## 2021-10-28 LAB — BRAIN NATRIURETIC PEPTIDE: B Natriuretic Peptide: 349.7 pg/mL — ABNORMAL HIGH (ref 0.0–100.0)

## 2021-12-04 ENCOUNTER — Ambulatory Visit: Payer: Medicare HMO | Admitting: Family

## 2021-12-18 ENCOUNTER — Ambulatory Visit (HOSPITAL_BASED_OUTPATIENT_CLINIC_OR_DEPARTMENT_OTHER): Payer: Medicare HMO | Admitting: Family

## 2021-12-18 ENCOUNTER — Telehealth: Payer: Self-pay | Admitting: Family

## 2021-12-18 ENCOUNTER — Encounter: Payer: Self-pay | Admitting: Family

## 2021-12-18 VITALS — BP 133/100 | HR 49 | Resp 16 | Ht 67.0 in | Wt 162.5 lb

## 2021-12-18 DIAGNOSIS — I482 Chronic atrial fibrillation, unspecified: Secondary | ICD-10-CM | POA: Diagnosis not present

## 2021-12-18 DIAGNOSIS — F0390 Unspecified dementia without behavioral disturbance: Secondary | ICD-10-CM | POA: Insufficient documentation

## 2021-12-18 DIAGNOSIS — F03A0 Unspecified dementia, mild, without behavioral disturbance, psychotic disturbance, mood disturbance, and anxiety: Secondary | ICD-10-CM

## 2021-12-18 DIAGNOSIS — R000 Tachycardia, unspecified: Secondary | ICD-10-CM | POA: Insufficient documentation

## 2021-12-18 DIAGNOSIS — I4891 Unspecified atrial fibrillation: Secondary | ICD-10-CM | POA: Insufficient documentation

## 2021-12-18 DIAGNOSIS — I5032 Chronic diastolic (congestive) heart failure: Secondary | ICD-10-CM | POA: Insufficient documentation

## 2021-12-18 DIAGNOSIS — I350 Nonrheumatic aortic (valve) stenosis: Secondary | ICD-10-CM | POA: Insufficient documentation

## 2021-12-18 DIAGNOSIS — E079 Disorder of thyroid, unspecified: Secondary | ICD-10-CM | POA: Insufficient documentation

## 2021-12-18 DIAGNOSIS — Z7951 Long term (current) use of inhaled steroids: Secondary | ICD-10-CM | POA: Insufficient documentation

## 2021-12-18 DIAGNOSIS — I10 Essential (primary) hypertension: Secondary | ICD-10-CM | POA: Diagnosis not present

## 2021-12-18 DIAGNOSIS — I110 Hypertensive heart disease with heart failure: Secondary | ICD-10-CM | POA: Insufficient documentation

## 2021-12-18 DIAGNOSIS — R635 Abnormal weight gain: Secondary | ICD-10-CM | POA: Insufficient documentation

## 2021-12-18 DIAGNOSIS — R5383 Other fatigue: Secondary | ICD-10-CM | POA: Insufficient documentation

## 2021-12-18 DIAGNOSIS — I480 Paroxysmal atrial fibrillation: Secondary | ICD-10-CM | POA: Diagnosis not present

## 2021-12-18 DIAGNOSIS — Z79899 Other long term (current) drug therapy: Secondary | ICD-10-CM | POA: Insufficient documentation

## 2021-12-18 DIAGNOSIS — Z8616 Personal history of COVID-19: Secondary | ICD-10-CM | POA: Insufficient documentation

## 2021-12-18 DIAGNOSIS — R0602 Shortness of breath: Secondary | ICD-10-CM | POA: Insufficient documentation

## 2021-12-18 DIAGNOSIS — R609 Edema, unspecified: Secondary | ICD-10-CM | POA: Insufficient documentation

## 2021-12-18 DIAGNOSIS — J449 Chronic obstructive pulmonary disease, unspecified: Secondary | ICD-10-CM | POA: Insufficient documentation

## 2021-12-18 MED ORDER — APIXABAN 5 MG PO TABS: 5.0000 mg | ORAL_TABLET | Freq: Two times a day (BID) | ORAL | 5 refills | Status: DC

## 2021-12-18 MED ORDER — METOPROLOL TARTRATE 50 MG PO TABS: 50.0000 mg | ORAL_TABLET | Freq: Two times a day (BID) | ORAL | 3 refills | Status: DC

## 2021-12-18 NOTE — Progress Notes (Signed)
el  Patient ID: Ann [name], female    DOB: 1919/10/08, 88 y.o.   MRN: [id number]  HPI  Ann [name] is a 88 y/o female with a history of hyperlipidemia, HTN, thyroid disease, aortic stenosis, COPD, dementia and chronic heart failure.   Echo report from 10/13/21 reviewed and showed an EF of 65-70% along with mild LVH/ LAE, moderate MR and mild/moderate AR.   Admitted 10/11/21 due to shortness of breath. Initially given IV lasix with transition to oral diuretics. Found to be covid +. Cardiology consult obtained. Received 5 days of reduced paxlovid. Elevated troponin thought to be due to demand ischemia. Discharged after 3 days.   She presents today for a follow-up visit with a chief complaint of moderate shortness of breath with minimal exertion. Describes this as chronic although does feel like it's gotten worse over the last several days although unclear exactly when it started to worsen. Ann [name] says that she has noticed that the patient is "breathing harder". She has associated fatigue, pedal edema and gradual weight gain along with this. She denies any difficulty sleeping, dizziness, cough, chest pain, palpitations or abdominal distention.   Currently taking her diuretic as 1 whole tablet every other day because her Ann [name] had difficulty in cutting the tablet in half to take 1/2 tablet every day.   Ann [name] still doesn't feel like her mom is drinking enough fluids. No additional sodium intake that they are aware of.   Past Medical History:  Diagnosis Date   Aortic stenosis    CHF (congestive heart failure) (HCC)    COPD (chronic obstructive pulmonary disease) (HCC)    Dementia (HCC)    High cholesterol    Hypertension    Thyroid disease    Past Surgical History:  Procedure Laterality Date   APPENDECTOMY     JOINT REPLACEMENT     THYROID SURGERY     History reviewed. No pertinent family history. Social History   Tobacco Use   Smoking status: Never   Smokeless tobacco: Not on  file  Substance Use Topics   Alcohol use: Not on file   Allergies  Allergen Reactions   Penicillins    Prior to Admission medications   Medication Sig Start Date End Date Taking? Authorizing Provider  acetaminophen (TYLENOL) 325 MG tablet Take 650 mg by mouth every 6 (six) hours as needed for headache. For pain   Yes [provider]  albuterol (PROVENTIL HFA;VENTOLIN HFA) 108 (90 Base) MCG/ACT inhaler Inhale 2 puffs into the lungs every 6 (six) hours as needed for wheezing or shortness of breath. Dispense 1 spacer to use with the inhaler 07/15/15  Yes Arnaldo Natal, MD  BREO ELLIPTA 100-25 MCG/ACT AEPB 1 puff daily. 09/24/21  Yes [provider]  furosemide (LASIX) 20 MG tablet Take 1 tablet (20 mg total) by mouth daily. Patient taking differently: Take 20 mg by mouth every other day. 10/15/21  Yes Charise Killian, MD  isosorbide mononitrate (IMDUR) 30 MG 24 hr tablet Take 30 mg by mouth daily. 05/08/21  Yes [provider]  levothyroxine (SYNTHROID, LEVOTHROID) 50 MCG tablet Take 50 mcg by mouth daily.   Yes [provider]  amLODipine (NORVASC) 2.5 MG tablet Take 2.5 mg by mouth daily. Patient not taking: Reported on 12/18/2021 07/01/21   [provider]  cyanocobalamin 1000 MCG tablet Take by mouth. Patient not taking: Reported on 12/18/2021    [provider]  losartan (COZAAR) 50 MG tablet Take 50 mg by mouth  daily. Patient not taking: Reported on 12/18/2021 08/07/15   [provider]   Review of Systems  Constitutional:  Positive for fatigue. Negative for appetite change.  HENT:  Negative for congestion, postnasal drip and sore throat.   Eyes: Negative.   Respiratory:  Positive for shortness of breath (worsening). Negative for cough.   Cardiovascular:  Positive for leg swelling (worsening). Negative for chest pain and palpitations.  Gastrointestinal:  Positive for constipation (at times). Negative for abdominal distention  and abdominal pain.  Endocrine: Negative.   Genitourinary: Negative.   Musculoskeletal:  Negative for back pain.  Skin: Negative.   Allergic/Immunologic: Negative.   Neurological:  Negative for dizziness and light-headedness.  Hematological:  Negative for adenopathy. Bruises/bleeds easily.  Psychiatric/Behavioral:  Negative for dysphoric mood and sleep disturbance (sleeping on 1 pillow). The patient is not nervous/anxious.     Vitals:   12/18/21 1251 12/18/21 1301  BP: (!) 133/100   Pulse: (!) 134 (!) 49  Resp: 16   SpO2: 100%   Weight: 162 lb 8 oz (73.7 kg)   Height: 5\' 7"  (1.702 m)    Wt Readings from Last 3 Encounters:  12/18/21 162 lb 8 oz (73.7 kg)  10/22/21 154 lb 8 oz (70.1 kg)  10/14/21 146 lb 8 oz (66.5 kg)   Lab Results  Component Value Date   CREATININE 0.66 10/14/2021   CREATININE 0.69 10/13/2021   CREATININE 0.60 10/12/2021   Physical Exam Vitals and nursing note reviewed. Exam conducted with a chaperone present (Ann [name]).  Constitutional:      Appearance: Normal appearance.  HENT:     Head: Normocephalic and atraumatic.  Cardiovascular:     Rate and Rhythm: Tachycardia present. Rhythm irregular.  Pulmonary:     Effort: Pulmonary effort is normal. No respiratory distress.     Breath sounds: No wheezing or rales.  Abdominal:     General: There is no distension.     Palpations: Abdomen is soft.     Tenderness: There is no abdominal tenderness.  Musculoskeletal:        General: No tenderness.     Cervical back: Normal range of motion and neck supple.     Right lower leg: No tenderness. Edema (1+ pitting) present.     Left lower leg: No tenderness. Edema (1+ pitting) present.  Skin:    General: Skin is warm and dry.  Neurological:     General: No focal deficit present.     Mental Status: She is alert and oriented to person, place, and time.  Psychiatric:        Mood and Affect: Mood normal.        Behavior: Behavior normal.        Thought Content:  Thought content normal.    Assessment & Plan:  1: Chronic heart failure preserved ejection fraction with structural changes (LVH/LAE)- - NYHA class III - fluid overloaded today with increased edema, symptoms and weight gain - weighing daily and home weight has ranged from 158-160 pounds; reminded to call for an overnight weight gain of > 2 pounds or a weekly weight gain of >5 pounds - weight up 8 pounds since last visit here 1 month ago - will increase her diuretic to daily - not adding much salt to her food; encouraged her to not add any salt to her food; Ann [name] is trying to be mindful of sodium content of foods - saw cardiology 12/12/2021) 07/29/21 - Ann [name] says that patient doesn't drink much fluids  during the day but they can't quantify amount; reviewed the importance of drinking between 60-64 ounces of fluids daily - BNP 10/11/21 was 394.5  2: HTN- - BP elevated (133/100); has not take her diuretic today as today was an "off" day; increasing diuretic to daily - sees PCP Arlana Pouch)  - BMP 10/14/21 reviewed and showed sodium 134, potassium 4.1, creatinine 0.66 and GFR >60  3: COPD- - using albuterol PRN and breo daily  4: Dementia- - lives with Ann [name] - + short-term memory loss - taking donepezil daily  5: Atrial fibrillation- - EKG done in the office due to tachycardia and irregular rhythm - new onset AF noted - will contact her cardiologist Beatrix Fetters) to discuss further - instructed patient & Ann [name] that I would call them after speaking with physician   Patient did not bring her medications nor a list. Each medication was verbally reviewed with the patient and she was encouraged to bring the bottles to every visit to confirm accuracy of list.   Return in 1 month, sooner if needed.

## 2021-12-18 NOTE — Telephone Encounter (Signed)
EKG done on the patient during her HF clinic appointment due to tachycardia. HR in the office consistently was >130. EKG done which showed HR 132 and atrial fibrillation which appears to be new onset.   Her primary cardiologist Beatrix Fetters) was called but he had already left for the day. My supervising MD Mariah Milling) was called to discuss this patient.   Options were to send patient to the ED for work up or start eliquis 5mg  BID & metoprolol tartrate 50mg  BID.   Patient and daughter had left the office but they were still in the lobby. Talked with both patient and her daughter about what atrial fibrillation is and potential complications (stroke, PE etc) as well as treatment with blood thinner/ betablocker and potential complication of the blood thinner (stroke or other bleeding etc).   After discussing these options, they opted to try the medications and avoid an ED visit. Did advise that should her SOB get any worse or she start to have chest pain or any other symptoms, then she should definitely go to the ED.   Will reach our to Dr. tomorrow to see about getting her a f/u appointment scheduled for this. Patient and daughter were comfortable with this plan.

## 2021-12-18 NOTE — Patient Instructions (Addendum)
Continue weighing daily and call for an overnight weight gain of 3 pounds or more or a weekly weight gain of more than 5 pounds.   If you have voicemail, please make sure your mailbox is cleaned out so that we may leave a message and please make sure to listen to any voicemails.    Change your fluid pill to every day.    I will call you later today after talking with the cardiologist.

## 2021-12-18 NOTE — Telephone Encounter (Addendum)
Late entry as daughter was called at 4:30pm. Called Dr Massie Bougie office to schedule patient an appointment and his office can see her tomorrow at 9:45am. Called patient and informed daughter of this appointment and emphasized the importance of keeping this appointment. Daughter verbalized understanding.

## 2021-12-19 ENCOUNTER — Emergency Department: Payer: Medicare HMO

## 2021-12-19 ENCOUNTER — Encounter: Payer: Self-pay | Admitting: Medical Oncology

## 2021-12-19 ENCOUNTER — Ambulatory Visit: Payer: Medicare HMO | Admitting: Family

## 2021-12-19 ENCOUNTER — Other Ambulatory Visit: Payer: Self-pay

## 2021-12-19 ENCOUNTER — Observation Stay: Payer: Medicare HMO

## 2021-12-19 ENCOUNTER — Inpatient Hospital Stay
Admission: EM | Admit: 2021-12-19 | Discharge: 2021-12-22 | DRG: 308 | Disposition: A | Discharge status: Home / Self Care | Payer: Medicare HMO | Attending: Internal Medicine | Admitting: Internal Medicine

## 2021-12-19 DIAGNOSIS — Z7901 Long term (current) use of anticoagulants: Secondary | ICD-10-CM

## 2021-12-19 DIAGNOSIS — Z79890 Hormone replacement therapy (postmenopausal): Secondary | ICD-10-CM

## 2021-12-19 DIAGNOSIS — I110 Hypertensive heart disease with heart failure: Secondary | ICD-10-CM | POA: Diagnosis present

## 2021-12-19 DIAGNOSIS — Z79899 Other long term (current) drug therapy: Secondary | ICD-10-CM

## 2021-12-19 DIAGNOSIS — Z8616 Personal history of COVID-19: Secondary | ICD-10-CM

## 2021-12-19 DIAGNOSIS — I4891 Unspecified atrial fibrillation: Secondary | ICD-10-CM | POA: Diagnosis present

## 2021-12-19 DIAGNOSIS — Z66 Do not resuscitate: Secondary | ICD-10-CM | POA: Diagnosis present

## 2021-12-19 DIAGNOSIS — I340 Nonrheumatic mitral (valve) insufficiency: Secondary | ICD-10-CM

## 2021-12-19 DIAGNOSIS — G309 Alzheimer's disease, unspecified: Secondary | ICD-10-CM | POA: Diagnosis present

## 2021-12-19 DIAGNOSIS — I509 Heart failure, unspecified: Secondary | ICD-10-CM

## 2021-12-19 DIAGNOSIS — F0390 Unspecified dementia without behavioral disturbance: Secondary | ICD-10-CM

## 2021-12-19 DIAGNOSIS — I4430 Unspecified atrioventricular block: Secondary | ICD-10-CM | POA: Diagnosis not present

## 2021-12-19 DIAGNOSIS — E7800 Pure hypercholesterolemia, unspecified: Secondary | ICD-10-CM | POA: Diagnosis present

## 2021-12-19 DIAGNOSIS — E785 Hyperlipidemia, unspecified: Secondary | ICD-10-CM | POA: Diagnosis present

## 2021-12-19 DIAGNOSIS — I10 Essential (primary) hypertension: Secondary | ICD-10-CM | POA: Diagnosis present

## 2021-12-19 DIAGNOSIS — F0280 Dementia in other diseases classified elsewhere without behavioral disturbance: Secondary | ICD-10-CM | POA: Diagnosis present

## 2021-12-19 DIAGNOSIS — Z880 Allergy status to penicillin: Secondary | ICD-10-CM

## 2021-12-19 DIAGNOSIS — M7989 Other specified soft tissue disorders: Secondary | ICD-10-CM | POA: Diagnosis present

## 2021-12-19 DIAGNOSIS — I080 Rheumatic disorders of both mitral and aortic valves: Secondary | ICD-10-CM | POA: Diagnosis present

## 2021-12-19 DIAGNOSIS — I482 Chronic atrial fibrillation, unspecified: Principal | ICD-10-CM

## 2021-12-19 DIAGNOSIS — E039 Hypothyroidism, unspecified: Secondary | ICD-10-CM | POA: Diagnosis present

## 2021-12-19 DIAGNOSIS — I5A Non-ischemic myocardial injury (non-traumatic): Secondary | ICD-10-CM | POA: Diagnosis present

## 2021-12-19 DIAGNOSIS — Z7951 Long term (current) use of inhaled steroids: Secondary | ICD-10-CM

## 2021-12-19 DIAGNOSIS — I5033 Acute on chronic diastolic (congestive) heart failure: Secondary | ICD-10-CM

## 2021-12-19 DIAGNOSIS — I480 Paroxysmal atrial fibrillation: Secondary | ICD-10-CM

## 2021-12-19 DIAGNOSIS — I472 Ventricular tachycardia, unspecified: Secondary | ICD-10-CM | POA: Diagnosis not present

## 2021-12-19 DIAGNOSIS — J449 Chronic obstructive pulmonary disease, unspecified: Secondary | ICD-10-CM | POA: Diagnosis present

## 2021-12-19 LAB — CBC
HCT: 38.2 % (ref 36.0–46.0)
Hemoglobin: 11.9 g/dL — ABNORMAL LOW (ref 12.0–15.0)
MCH: 30.7 pg (ref 26.0–34.0)
MCHC: 31.2 g/dL (ref 30.0–36.0)
MCV: 98.5 fL (ref 80.0–100.0)
Platelets: 288 10*3/uL (ref 150–400)
RBC: 3.88 MIL/uL (ref 3.87–5.11)
RDW: 15.6 % — ABNORMAL HIGH (ref 11.5–15.5)
WBC: 4.2 10*3/uL (ref 4.0–10.5)
nRBC: 0 % (ref 0.0–0.2)

## 2021-12-19 LAB — MAGNESIUM
Magnesium: 2.3 mg/dL (ref 1.7–2.4)
Magnesium: 2.1 mg/dL (ref 1.7–2.4)

## 2021-12-19 LAB — BASIC METABOLIC PANEL
Anion gap: 10 (ref 5–15)
BUN: 27 mg/dL — ABNORMAL HIGH (ref 8–23)
CO2: 24 mmol/L (ref 22–32)
Calcium: 9 mg/dL (ref 8.9–10.3)
Chloride: 107 mmol/L (ref 98–111)
Creatinine, Ser: 0.88 mg/dL (ref 0.44–1.00)
GFR, Estimated: 59 mL/min — ABNORMAL LOW (ref >60)
Glucose, Bld: 95 mg/dL (ref 70–99)
Potassium: 4.4 mmol/L (ref 3.5–5.1)
Sodium: 141 mmol/L (ref 135–145)

## 2021-12-19 LAB — URINALYSIS, COMPLETE (UACMP) WITH MICROSCOPIC
Bilirubin Urine: NEGATIVE
Glucose, UA: NEGATIVE mg/dL
Ketones, ur: NEGATIVE mg/dL
Nitrite: NEGATIVE
Protein, ur: NEGATIVE mg/dL
Specific Gravity, Urine: 1.008 (ref 1.005–1.030)
pH: 5 (ref 5.0–8.0)

## 2021-12-19 LAB — PHOSPHORUS: Phosphorus: 4.4 mg/dL (ref 2.5–4.6)

## 2021-12-19 LAB — TSH: TSH: 1.522 u[IU]/mL (ref 0.350–4.500)

## 2021-12-19 LAB — BRAIN NATRIURETIC PEPTIDE: B Natriuretic Peptide: 815.1 pg/mL — ABNORMAL HIGH (ref 0.0–100.0)

## 2021-12-19 LAB — TROPONIN I (HIGH SENSITIVITY): Troponin I (High Sensitivity): 22 ng/L — ABNORMAL HIGH (ref <18)

## 2021-12-19 MED ORDER — FUROSEMIDE 10 MG/ML IJ SOLN
20.0000 mg | Freq: Once | INTRAMUSCULAR | Status: AC
  Administered 2021-12-19: 20 mg via INTRAVENOUS
  Filled 2021-12-19: qty 4

## 2021-12-19 MED ORDER — ACETAMINOPHEN 650 MG RE SUPP
650.0000 mg | Freq: Four times a day (QID) | RECTAL | Status: DC | PRN
Start: 2021-12-19 — End: 2021-12-22

## 2021-12-19 MED ORDER — FUROSEMIDE 10 MG/ML IJ SOLN: 20.0000 mg | Freq: Every day | INTRAMUSCULAR | Status: DC

## 2021-12-19 MED ORDER — ONDANSETRON HCL 4 MG PO TABS: 4.0000 mg | ORAL_TABLET | Freq: Four times a day (QID) | ORAL | Status: DC | PRN

## 2021-12-19 MED ORDER — LEVOTHYROXINE SODIUM 50 MCG PO TABS
50.0000 ug | ORAL_TABLET | Freq: Every day | ORAL | Status: DC
  Administered 2021-12-20 – 2021-12-22 (×2): 50 ug via ORAL
  Filled 2021-12-19 (×2): qty 1

## 2021-12-19 MED ORDER — APIXABAN 5 MG PO TABS
5.0000 mg | ORAL_TABLET | Freq: Two times a day (BID) | ORAL | Status: DC
  Administered 2021-12-19 – 2021-12-22 (×6): 5 mg via ORAL
  Filled 2021-12-19 (×7): qty 1

## 2021-12-19 MED ORDER — SENNOSIDES-DOCUSATE SODIUM 8.6-50 MG PO TABS: 1.0000 | ORAL_TABLET | Freq: Every evening | ORAL | Status: DC | PRN

## 2021-12-19 MED ORDER — ALBUTEROL SULFATE (2.5 MG/3ML) 0.083% IN NEBU: 2.5000 mg | INHALATION_SOLUTION | Freq: Four times a day (QID) | RESPIRATORY_TRACT | Status: DC | PRN

## 2021-12-19 MED ORDER — METOPROLOL TARTRATE 5 MG/5ML IV SOLN
2.5000 mg | INTRAVENOUS | Status: DC | PRN
  Administered 2021-12-20 – 2021-12-21 (×2): 2.5 mg via INTRAVENOUS
  Filled 2021-12-19 (×2): qty 5

## 2021-12-19 MED ORDER — POLYETHYLENE GLYCOL 3350 17 G PO PACK: 17.0000 g | PACK | Freq: Two times a day (BID) | ORAL | Status: DC | PRN

## 2021-12-19 MED ORDER — ONDANSETRON HCL 4 MG/2ML IJ SOLN: 4.0000 mg | Freq: Four times a day (QID) | INTRAMUSCULAR | Status: DC | PRN

## 2021-12-19 MED ORDER — ISOSORBIDE MONONITRATE ER 30 MG PO TB24
30.0000 mg | ORAL_TABLET | Freq: Every day | ORAL | Status: DC
Start: 2021-12-19 — End: 2021-12-22
  Administered 2021-12-20 – 2021-12-22 (×3): 30 mg via ORAL
  Filled 2021-12-19 (×3): qty 1

## 2021-12-19 MED ORDER — METOPROLOL TARTRATE 50 MG PO TABS
50.0000 mg | ORAL_TABLET | Freq: Two times a day (BID) | ORAL | Status: DC
  Administered 2021-12-19 – 2021-12-22 (×6): 50 mg via ORAL
  Filled 2021-12-19 (×6): qty 1

## 2021-12-19 MED ORDER — ACETAMINOPHEN 325 MG PO TABS
650.0000 mg | ORAL_TABLET | Freq: Four times a day (QID) | ORAL | Status: DC | PRN
  Administered 2021-12-19: 650 mg via ORAL
  Filled 2021-12-19: qty 2

## 2021-12-19 MED ORDER — FLUTICASONE FUROATE-VILANTEROL 100-25 MCG/ACT IN AEPB
1.0000 | INHALATION_SPRAY | Freq: Every day | RESPIRATORY_TRACT | Status: DC
  Administered 2021-12-20 – 2021-12-22 (×3): 1 via RESPIRATORY_TRACT
  Filled 2021-12-19: qty 28

## 2021-12-19 MED ORDER — DILTIAZEM HCL 25 MG/5ML IV SOLN
10.0000 mg | Freq: Once | INTRAVENOUS | Status: AC
  Administered 2021-12-19: 10 mg via INTRAVENOUS
  Filled 2021-12-19: qty 5

## 2021-12-19 NOTE — H&P (Addendum)
History and Physical   Ann [name] LNL:[id number] DOB: Jun 15, 1919 DOA: 12/19/2021  PCP: Jaclyn Shaggy, MD  Outpatient Specialists: Dr. [facility] cardiology Patient coming from: outpatient clinic  I have personally briefly reviewed patient's old medical records in [facility] EMR.  Chief Concern: Atrial fibrillation with RVR  HPI: Ann [name] is a 88 year old female with history of atrial fibrillation currently on Eliquis, hypertension, heart failure preserved ejection fraction, memory loss, hypothyroid, who presents emergency department from [facility] clinic cardiology clinic for chief concerns of atrial fibrillation with RVR.  Initial vitals in the emergency department showed temperature 97.6, respiration rate of 33, heart rate of 119, blood pressure 120/101, SPO2 of 95% on room air.  Serum sodium is 141, potassium 4.4, chloride 107, bicarb 24, BUN of 27, serum creatinine of 0.88, GFR 59, nonfasting blood glucose 95, WBC 4.2, hemoglobin 11.9, platelets of 288.  BNP is 815.1, high sensitive troponin was 22. Magnesium is 2.3.  ED treatment: Furosemide 20 mg IV one-time dose, diltiazem 10 mg IV injection.  Per EDP, after patient one-time administration of diltiazem 10 mg IV, patient experience 6 beats of V. tach, strip printed.   At bedside, she is able to tell me her name, age, and current calendar year with some difficulty. She was not able her current location of hospital. She states she is in [place] (she moved to [facility] since 2016).  At bedside she reports dyspnea both on exertion and with laying down flat.  She also endorses swelling of her bilateral lower legs that started about 1 week ago.  She denies any trauma to her person.  Daughter at bedside states that she was just diagnosed with A-fib on 12/18/2021, and was recently prescribed Eliquis which she has started taking yesterday evening.  Social history: She lives with her daughter, Domingo Pulse. She denies tobacco,  etoh, recreational drug use. She is retired and formerly worked as an Tourist information centre manager.   ROS: Constitutional: no weight change, no fever ENT/Mouth: no sore throat, no rhinorrhea Eyes: no eye pain, no vision changes Cardiovascular: no chest pain, + dyspnea,  no edema, no palpitations Respiratory: no cough, no sputum, no wheezing Gastrointestinal: no nausea, no vomiting, no diarrhea, no constipation Genitourinary: no urinary incontinence, no dysuria, no hematuria Musculoskeletal: no arthralgias, no myalgias Skin: no skin lesions, no pruritus, Neuro: + weakness, no loss of consciousness, no syncope Psych: no anxiety, no depression, + decrease appetite Heme/Lymph: no bruising, no bleeding  ED Course: Discussed with emergency medicine provider, patient requiring hospitalization for chief concerns of atrial fibrillation with RVR.  Assessment/Plan  Principal Problem:   Atrial fibrillation with RVR (HCC) Active Problems:   Benign essential hypertension   Hypothyroid   Hyperlipidemia   Myocardial injury   Leg swelling   Assessment and Plan:  * Atrial fibrillation with RVR (HCC) - Etiology work-up in progress - I have resumed home apixaban 5 mg p.o. twice daily - Check UA analysis, portable chest x-ray - Strict I's and O's - Metoprolol 2.5 mg IV every 2 hours as needed for heart rate greater than 120, 4 doses ordered - Limited echo on 10/13/2021, was read as estimated ejection fraction 65 to 70%, mild left ventricular hypertrophy.  Left ventricular diastolic parameters were normal. - Given patient's recent diagnosis of atrial fibrillation on 12/18/2021 and now with RVR on day of admission, I have ordered a complete echo - AM team to consult cardiology if indicated - Admit to progressive cardiac, observation  Leg  swelling - Bilateral lower extremity ultrasound to assess for DVT ordered  Myocardial injury - Presumed secondary to atrial fibrillation with RVR - We will  continue to monitor and assess for heparin GTT need  Hypothyroid - Levothyroxine 50 mcg daily resumed  Benign essential hypertension - Resumed home metoprolol tartrate 50 mg p.o. twice daily, isosorbide mononitrate 30 mg daily  Chart reviewed.   DVT prophylaxis: Apixaban 5 mg p.o. twice daily Code Status: DNR/DNI Diet: Heart healthy Family Communication: Updated daughter at bedside with patient's permission Disposition Plan: Pending clinical course Consults called: None at this time Admission status: Progressive cardiac, observation  Past Medical History:  Diagnosis Date   Aortic stenosis    CHF (congestive heart failure) (HCC)    COPD (chronic obstructive pulmonary disease) (HCC)    Dementia (HCC)    High cholesterol    Hypertension    Thyroid disease    Past Surgical History:  Procedure Laterality Date   APPENDECTOMY     JOINT REPLACEMENT     THYROID SURGERY     Social History:  reports that she has never smoked. She does not have any smokeless tobacco history on file. No history on file for alcohol use and drug use.  Allergies  Allergen Reactions   Penicillins    History reviewed. No pertinent family history. Family history: Family history reviewed and not pertinent.  Prior to Admission medications   Medication Sig Start Date End Date Taking? Authorizing Provider  acetaminophen (TYLENOL) 325 MG tablet Take 650 mg by mouth every 6 (six) hours as needed for headache. For pain    [provider]  albuterol (PROVENTIL HFA;VENTOLIN HFA) 108 (90 Base) MCG/ACT inhaler Inhale 2 puffs into the lungs every 6 (six) hours as needed for wheezing or shortness of breath. Dispense 1 spacer to use with the inhaler 07/15/15   Arnaldo Natal, MD  amLODipine (NORVASC) 2.5 MG tablet Take 2.5 mg by mouth daily. Patient not taking: Reported on 12/18/2021 07/01/21   [provider]  apixaban (ELIQUIS) 5 MG TABS tablet Take 1 tablet (5 mg total) by mouth 2 (two) times daily.  12/18/21   Hackney, Tina A, FNP  BREO ELLIPTA 100-25 MCG/ACT AEPB 1 puff daily. 09/24/21   [provider]  cyanocobalamin 1000 MCG tablet Take by mouth. Patient not taking: Reported on 12/18/2021    [provider]  furosemide (LASIX) 20 MG tablet Take 1 tablet (20 mg total) by mouth daily. Patient taking differently: Take 20 mg by mouth every other day. 10/15/21   Charise Killian, MD  isosorbide mononitrate (IMDUR) 30 MG 24 hr tablet Take 30 mg by mouth daily. 05/08/21   [provider]  levothyroxine (SYNTHROID, LEVOTHROID) 50 MCG tablet Take 50 mcg by mouth daily.    [provider]  losartan (COZAAR) 50 MG tablet Take 50 mg by mouth daily. Patient not taking: Reported on 12/18/2021 08/07/15   [provider]  metoprolol tartrate (LOPRESSOR) 50 MG tablet Take 1 tablet (50 mg total) by mouth 2 (two) times daily. 12/18/21   Delma Freeze, FNP   Physical Exam: Vitals:   12/19/21 1525 12/19/21 1530 12/19/21 1545 12/19/21 1630  BP: 138/89 113/89    Pulse: 91  (!) 107 (!) 119  Resp: 16 (!) 38 19 (!) 28  Temp:      TempSrc:      SpO2: 100% 95% 98% 97%  Weight:      Height:  Constitutional: appears age-appropriate, NAD, calm, comfortable Eyes: PERRL, lids and conjunctivae normal ENMT: Mucous membranes are moist. Posterior pharynx clear of any exudate or lesions. Age-appropriate dentition.  Moderate to severe hearing loss Neck: normal, supple, no masses, no thyromegaly Respiratory: clear to auscultation bilaterally, no wheezing, no crackles. Normal respiratory effort. No accessory muscle use.  Cardiovascular: Regular rate and rhythm, no murmurs / rubs / gallops.  Bilateral lower extremity extremity edema. 2+ pedal pulses. No carotid bruits.  Abdomen: no tenderness, no masses palpated, no hepatosplenomegaly. Bowel sounds positive.  Musculoskeletal: no clubbing / cyanosis. No joint deformity upper and lower extremities. Good ROM, no contractures,  no atrophy. Normal muscle tone.  Skin: no rashes, lesions, ulcers. No induration Neurologic: Sensation intact. Strength 5/5 in all 4.  Psychiatric: Normal judgment and insight. Alert and oriented x 3. Normal mood.   EKG: independently reviewed, showing atrial fibrillation with rate of 90, QTc 454  Chest x-ray on Admission: I personally reviewed and I agree with radiologist reading as below.  DG Chest Port 1 View  Result Date: 12/19/2021 CLINICAL DATA:  Atrial fibrillation with rapid ventricular response. Fatigue. EXAM: PORTABLE CHEST 1 VIEW COMPARISON:  Radiographs earlier the same date and 10/11/2021. FINDINGS: 1508 hours. Stable cardiomegaly and aortic atherosclerosis. There is increased vascular congestion with possible mild pulmonary edema. No confluent airspace opacity, pneumothorax or significant pleural effusion. The bones are demineralized without acute osseous findings. Telemetry leads overlie the chest. IMPRESSION: Cardiomegaly with increased vascular congestion and possible mild pulmonary edema. Electronically Signed   By: Richardean Sale M.D.   On: 12/19/2021 15:17   DG Chest 2 View  Result Date: 12/19/2021 CLINICAL DATA:  New onset atrial fibrillation EXAM: CHEST - 2 VIEW COMPARISON:  10/11/2021 FINDINGS: The heart is mildly enlarged.  No pulmonary vascular congestion. The descending thoracic aorta is dilated measuring up to 4.9 cm. Patchy opacities at the left lung base likely due to atelectasis. Lungs otherwise clear. IMPRESSION: 1. Mild cardiomegaly. 2. Dilated descending thoracic aorta measuring up to 4.9 cm can be better evaluated with CT angiography of the chest, if clinically appropriate. Electronically Signed   By: Miachel Roux M.D.   On: 12/19/2021 11:20    Labs on Admission: I have personally reviewed following labs  CBC: Recent Labs  Lab 12/19/21 1049  WBC 4.2  HGB 11.9*  HCT 38.2  MCV 98.5  PLT 123XX123   Basic Metabolic Panel: Recent Labs  Lab 12/19/21 1049  NA  141  K 4.4  CL 107  CO2 24  GLUCOSE 95  BUN 27*  CREATININE 0.88  CALCIUM 9.0  MG 2.3   GFR: Estimated Creatinine Clearance: 33.9 mL/min (by C-G formula based on SCr of 0.88 mg/dL).  Urine analysis:    Component Value Date/Time   COLORURINE Yellow 08/10/2013 2028   APPEARANCEUR Hazy 08/10/2013 2028   LABSPEC 1.016 08/10/2013 2028   PHURINE 5.0 08/10/2013 2028   GLUCOSEU Negative 08/10/2013 2028   HGBUR 3+ 08/10/2013 2028   BILIRUBINUR Negative 08/10/2013 2028   KETONESUR Negative 08/10/2013 2028   PROTEINUR Negative 08/10/2013 2028   NITRITE Negative 08/10/2013 2028   LEUKOCYTESUR 3+ 08/10/2013 2028   Dr. Tobie Poet [facility]  If 7PM-7AM, please contact overnight-coverage provider If 7AM-7PM, please contact day coverage provider [email or web address]  12/19/2021, 4:51 PM

## 2021-12-19 NOTE — Assessment & Plan Note (Signed)
-   Presumed secondary to atrial fibrillation with RVR - We will continue to monitor and assess for heparin GTT need

## 2021-12-19 NOTE — Assessment & Plan Note (Signed)
-   Levothyroxine 50 mcg daily resumed ?

## 2021-12-19 NOTE — ED Notes (Addendum)
Dr. Sedalia Muta notified pt's HR is 100-120, intermittently popping up to 130, pt's denies CP, SOB, no apparent distress. This RN asked dr. Sedalia Muta if pt's prn lopressor should be given. Dr. Sedalia Muta states just continue to monitor if pt. Is stable otherwise.

## 2021-12-19 NOTE — ED Notes (Signed)
Pt. Provided additional warm blankets. Denies further need.

## 2021-12-19 NOTE — Assessment & Plan Note (Signed)
-   Resumed home metoprolol tartrate 50 mg p.o. twice daily, isosorbide mononitrate 30 mg daily

## 2021-12-19 NOTE — ED Notes (Signed)
Pt. Had 6 beat run of vtach, strip printed, MD notified.

## 2021-12-19 NOTE — Hospital Course (Addendum)
Ms. Ann [name] is a 88 year old female with history of atrial fibrillation currently on Eliquis, hypertension, heart failure preserved ejection fraction, memory loss, hypothyroid, who presents emergency department from [facility] clinic cardiology clinic for chief concerns of atrial fibrillation with RVR. Patient also has significant leg edema, chest x-ray showed mild pulmonary edema, elevated BNP.  Patient also had shortness of breath and orthopnea.  Consistent with acute on chronic diastolic congestive heart failure. Patient was continued on metoprolol, added diltiazem.  Also started on IV Lasix. 7/15.  Patient developed AV block with a pause of 4 seconds, diltiazem discontinued.

## 2021-12-19 NOTE — Assessment & Plan Note (Signed)
-   Bilateral lower extremity ultrasound to assess for DVT ordered 

## 2021-12-19 NOTE — ED Provider Notes (Signed)
[facility] Provider Note    Event Date/Time   First MD Initiated Contact with Patient 12/19/21 1151     (approximate)   History   Atrial Fibrillation   HPI  Ann [name] is a 88 y.o. female with past medical history of hypertension, hyperlipidemia, CHF, COPD, here with shortness of breath and fatigue.  Patient sent in from cardiology clinic.  She reportedly has seemed more tired, fatigued, and short of breath over the last week or so.  She went to cardiology clinic today and was sent here after being found in A-fib RVR.  She reportedly was seen yesterday by a physician, diagnosed with A-fib and had been started on anticoagulation and metoprolol.  She went to the cardiologist today and was sent here due to shortness of breath and significant fluid overload.  She has had increasing lower extremity edema.  No complaints currently.  No chest pain.  No sputum production.     Physical Exam   Triage Vital Signs: ED Triage Vitals  Enc Vitals Group     BP 12/19/21 1200 (!) 120/101     Pulse Rate 12/19/21 1200 (!) 119     Resp 12/19/21 1200 (!) 30     Temp --      Temp src --      SpO2 12/19/21 1200 95 %     Weight 12/19/21 1037 160 lb 15 oz (73 kg)     Height 12/19/21 1037 5\' 7"  (1.702 m)     Head Circumference --      Peak Flow --      Pain Score 12/19/21 1037 0     Pain Loc --      Pain Edu? --      Excl. in GC? --     Most recent vital signs: Vitals:   12/19/21 1445 12/19/21 1525  BP:  138/89  Pulse:  91  Resp:  16  Temp: 97.6 F (36.4 C)   SpO2:  100%     General: Awake, no distress.  CV:  Good peripheral perfusion.  Tachycardic, irregularly irregular rhythm. Resp:  Normal effort.  Mild tachypnea. Abd:  No distention.  No tenderness. Other:  2+ pitting edema bilateral lower extremities.   ED Results / Procedures / Treatments   Labs (all labs ordered are listed, but only abnormal results are displayed) Labs Reviewed  BASIC  METABOLIC PANEL - Abnormal; Notable for the following components:      Result Value   BUN 27 (*)    GFR, Estimated 59 (*)    All other components within normal limits  CBC - Abnormal; Notable for the following components:   Hemoglobin 11.9 (*)    RDW 15.6 (*)    All other components within normal limits  BRAIN NATRIURETIC PEPTIDE - Abnormal; Notable for the following components:   B Natriuretic Peptide 815.1 (*)    All other components within normal limits  TROPONIN I (HIGH SENSITIVITY) - Abnormal; Notable for the following components:   Troponin I (High Sensitivity) 22 (*)    All other components within normal limits  MAGNESIUM  PHOSPHORUS  MAGNESIUM  TSH  URINALYSIS, COMPLETE (UACMP) WITH MICROSCOPIC     EKG Atrial fibrillation, ventricular rate 90.  QRS 132, QTc 454.  No acute ST elevations or depressions.   RADIOLOGY Chest x-ray: Mild cardiomegaly, dilated descending thoracic aorta    I also independently reviewed and agree with radiologist interpretations.   PROCEDURES:  Critical Care performed:  No  .1-3 Lead EKG Interpretation  Performed by: Shaune Pollack, MD Authorized by: Shaune Pollack, MD     Interpretation: abnormal     ECG rate:  90-120   ECG rate assessment: normal     Rhythm: atrial fibrillation     Ectopy: none     Conduction: normal   Comments:     Indication: Afib     MEDICATIONS ORDERED IN ED: Medications  levothyroxine (SYNTHROID) tablet 50 mcg (has no administration in time range)  acetaminophen (TYLENOL) tablet 650 mg (has no administration in time range)    Or  acetaminophen (TYLENOL) suppository 650 mg (has no administration in time range)  ondansetron (ZOFRAN) tablet 4 mg (has no administration in time range)    Or  ondansetron (ZOFRAN) injection 4 mg (has no administration in time range)  furosemide (LASIX) injection 20 mg (has no administration in time range)  senna-docusate (Senokot-S) tablet 1 tablet (has no  administration in time range)  polyethylene glycol (MIRALAX / GLYCOLAX) packet 17 g (has no administration in time range)  metoprolol tartrate (LOPRESSOR) injection 2.5 mg (has no administration in time range)  isosorbide mononitrate (IMDUR) 24 hr tablet 30 mg (has no administration in time range)  metoprolol tartrate (LOPRESSOR) tablet 50 mg (has no administration in time range)  apixaban (ELIQUIS) tablet 5 mg (has no administration in time range)  albuterol (VENTOLIN HFA) 108 (90 Base) MCG/ACT inhaler 2 puff (has no administration in time range)  fluticasone furoate-vilanterol (BREO ELLIPTA) 100-25 MCG/ACT 1 puff (has no administration in time range)  diltiazem (CARDIZEM) injection 10 mg (10 mg Intravenous Given 12/19/21 1304)  furosemide (LASIX) injection 20 mg (20 mg Intravenous Given 12/19/21 1310)     IMPRESSION / MDM / ASSESSMENT AND PLAN / ED COURSE  I reviewed the triage vital signs and the nursing notes.                               The patient is on the cardiac monitor to evaluate for evidence of arrhythmia and/or significant heart rate changes.   Ddx:  Differential includes the following, with pertinent life- or limb-threatening emergencies considered:  A-fib RVR, CHF, anemia, deconditioning, AKI with fluid retention, less likely ACS, PE  Patient's presentation is most consistent with acute presentation with potential threat to life or bodily function.  MDM:  88 year old female with history of CHF, A-fib, here with generalized weakness and shortness of breath.  Clinically, the patient appears to be in A-fib RVR with hypervolemia and likely CHF exacerbation.  Chest x-ray shows pulmonary edema on my review.  Troponin 22 and BNP elevated at 815.  CBC shows no leukocytosis, denies any cough or sputum production or evidence to suggest pneumonia or infectious process.  BMP shows normal renal function.  EKG shows atrial fibrillation but no ischemic changes and she denies any chest  pain.  Reviewed cardiology notes from clinic today.  Will plan for IV diuresis, IV rate control, and admit.  She did have a 6-7 beat run of V. tach, but this resolved and she was asymptomatic during this.  Will need to be on telemetry for admission.  Hospitalist consulted and will admit.   MEDICATIONS GIVEN IN ED:    Consults:  Hospitalist   EMR reviewed  Reviewed cardiology note from [facility] today, noting new onset A-fib as well as hypervolemia     FINAL CLINICAL IMPRESSION(S) / ED DIAGNOSES   Final  diagnoses:  Paroxysmal atrial fibrillation (HCC)  Acute congestive heart failure, unspecified heart failure type (HCC)     Rx / DC Orders   ED Discharge Orders     None        Note:  This document was prepared using Dragon voice recognition software and may include unintentional dictation errors.   Shaune Pollack, MD 12/19/21 [phone]

## 2021-12-19 NOTE — Assessment & Plan Note (Addendum)
-   Etiology work-up in progress - I have resumed home apixaban 5 mg p.o. twice daily - Check UA analysis, portable chest x-ray - Strict I's and O's - Metoprolol 2.5 mg IV every 2 hours as needed for heart rate greater than 120, 4 doses ordered - Limited echo on 10/13/2021, was read as estimated ejection fraction 65 to 70%, mild left ventricular hypertrophy.  Left ventricular diastolic parameters were normal. - Given patient's recent diagnosis of atrial fibrillation on 12/18/2021 and now with RVR on day of admission, I have ordered a complete echo - AM team to consult cardiology if indicated - Admit to progressive cardiac, observation

## 2021-12-19 NOTE — ED Triage Notes (Signed)
Pt sent over from [facility] cardiology with Afib RVR. Pt reports that she is not having pain, just feels really fatigued. Pt A/O x 4.

## 2021-12-20 ENCOUNTER — Inpatient Hospital Stay: Payer: Medicare HMO

## 2021-12-20 ENCOUNTER — Observation Stay
Admit: 2021-12-20 | Discharge: 2021-12-20 | Disposition: A | Discharge status: Home / Self Care | Payer: Medicare HMO | Attending: Internal Medicine | Admitting: Internal Medicine

## 2021-12-20 DIAGNOSIS — F0390 Unspecified dementia without behavioral disturbance: Secondary | ICD-10-CM

## 2021-12-20 DIAGNOSIS — I10 Essential (primary) hypertension: Secondary | ICD-10-CM | POA: Diagnosis not present

## 2021-12-20 DIAGNOSIS — I5033 Acute on chronic diastolic (congestive) heart failure: Secondary | ICD-10-CM | POA: Diagnosis present

## 2021-12-20 DIAGNOSIS — Z79890 Hormone replacement therapy (postmenopausal): Secondary | ICD-10-CM | POA: Diagnosis not present

## 2021-12-20 DIAGNOSIS — I482 Chronic atrial fibrillation, unspecified: Secondary | ICD-10-CM

## 2021-12-20 DIAGNOSIS — Z8616 Personal history of COVID-19: Secondary | ICD-10-CM | POA: Diagnosis not present

## 2021-12-20 DIAGNOSIS — I340 Nonrheumatic mitral (valve) insufficiency: Secondary | ICD-10-CM

## 2021-12-20 DIAGNOSIS — I110 Hypertensive heart disease with heart failure: Secondary | ICD-10-CM | POA: Diagnosis present

## 2021-12-20 DIAGNOSIS — I480 Paroxysmal atrial fibrillation: Secondary | ICD-10-CM | POA: Diagnosis present

## 2021-12-20 DIAGNOSIS — I472 Ventricular tachycardia, unspecified: Secondary | ICD-10-CM | POA: Diagnosis not present

## 2021-12-20 DIAGNOSIS — F0280 Dementia in other diseases classified elsewhere without behavioral disturbance: Secondary | ICD-10-CM | POA: Diagnosis present

## 2021-12-20 DIAGNOSIS — M7989 Other specified soft tissue disorders: Secondary | ICD-10-CM | POA: Diagnosis present

## 2021-12-20 DIAGNOSIS — I4891 Unspecified atrial fibrillation: Secondary | ICD-10-CM | POA: Diagnosis not present

## 2021-12-20 DIAGNOSIS — E039 Hypothyroidism, unspecified: Secondary | ICD-10-CM | POA: Diagnosis present

## 2021-12-20 DIAGNOSIS — Z7951 Long term (current) use of inhaled steroids: Secondary | ICD-10-CM | POA: Diagnosis not present

## 2021-12-20 DIAGNOSIS — Z7901 Long term (current) use of anticoagulants: Secondary | ICD-10-CM | POA: Diagnosis not present

## 2021-12-20 DIAGNOSIS — I5A Non-ischemic myocardial injury (non-traumatic): Secondary | ICD-10-CM | POA: Diagnosis present

## 2021-12-20 DIAGNOSIS — Z880 Allergy status to penicillin: Secondary | ICD-10-CM | POA: Diagnosis not present

## 2021-12-20 DIAGNOSIS — Z79899 Other long term (current) drug therapy: Secondary | ICD-10-CM | POA: Diagnosis not present

## 2021-12-20 DIAGNOSIS — E7800 Pure hypercholesterolemia, unspecified: Secondary | ICD-10-CM | POA: Diagnosis present

## 2021-12-20 DIAGNOSIS — I4430 Unspecified atrioventricular block: Secondary | ICD-10-CM | POA: Diagnosis not present

## 2021-12-20 DIAGNOSIS — I080 Rheumatic disorders of both mitral and aortic valves: Secondary | ICD-10-CM | POA: Diagnosis present

## 2021-12-20 DIAGNOSIS — J449 Chronic obstructive pulmonary disease, unspecified: Secondary | ICD-10-CM | POA: Diagnosis present

## 2021-12-20 DIAGNOSIS — G309 Alzheimer's disease, unspecified: Secondary | ICD-10-CM | POA: Diagnosis present

## 2021-12-20 DIAGNOSIS — Z66 Do not resuscitate: Secondary | ICD-10-CM | POA: Diagnosis present

## 2021-12-20 LAB — BASIC METABOLIC PANEL
Anion gap: 8 (ref 5–15)
BUN: 33 mg/dL — ABNORMAL HIGH (ref 8–23)
CO2: 24 mmol/L (ref 22–32)
Calcium: 8.7 mg/dL — ABNORMAL LOW (ref 8.9–10.3)
Chloride: 108 mmol/L (ref 98–111)
Creatinine, Ser: 0.91 mg/dL (ref 0.44–1.00)
GFR, Estimated: 57 mL/min — ABNORMAL LOW (ref >60)
Glucose, Bld: 99 mg/dL (ref 70–99)
Potassium: 3.7 mmol/L (ref 3.5–5.1)
Sodium: 140 mmol/L (ref 135–145)

## 2021-12-20 LAB — CBC
HCT: 36.9 % (ref 36.0–46.0)
Hemoglobin: 11.8 g/dL — ABNORMAL LOW (ref 12.0–15.0)
MCH: 31.1 pg (ref 26.0–34.0)
MCHC: 32 g/dL (ref 30.0–36.0)
MCV: 97.4 fL (ref 80.0–100.0)
Platelets: 269 10*3/uL (ref 150–400)
RBC: 3.79 MIL/uL — ABNORMAL LOW (ref 3.87–5.11)
RDW: 15.6 % — ABNORMAL HIGH (ref 11.5–15.5)
WBC: 4.3 10*3/uL (ref 4.0–10.5)
nRBC: 0 % (ref 0.0–0.2)

## 2021-12-20 MED ORDER — ORAL CARE MOUTH RINSE: 15.0000 mL | OROMUCOSAL | Status: DC | PRN

## 2021-12-20 MED ORDER — MORPHINE SULFATE (PF) 2 MG/ML IV SOLN
1.0000 mg | INTRAVENOUS | Status: DC | PRN
  Administered 2021-12-20: 1 mg via INTRAVENOUS
  Filled 2021-12-20: qty 1

## 2021-12-20 MED ORDER — DILTIAZEM HCL ER COATED BEADS 180 MG PO CP24
180.0000 mg | ORAL_CAPSULE | Freq: Every day | ORAL | Status: DC
  Administered 2021-12-20: 180 mg via ORAL
  Filled 2021-12-20: qty 1

## 2021-12-20 MED ORDER — DILTIAZEM HCL 25 MG/5ML IV SOLN: 5.0000 mg | Freq: Once | INTRAVENOUS | Status: DC

## 2021-12-20 MED ORDER — FUROSEMIDE 10 MG/ML IJ SOLN
40.0000 mg | Freq: Two times a day (BID) | INTRAMUSCULAR | Status: DC
  Administered 2021-12-20 – 2021-12-21 (×3): 40 mg via INTRAVENOUS
  Filled 2021-12-20 (×3): qty 4

## 2021-12-20 NOTE — Progress Notes (Signed)
Pt changed to red mews. Pt is in Afib a and HR is not consistently over 110. HR range from 70-120s fluctuating.  IV cardizem not given at this time d/t HR is not consistently over 120s.   12/20/21 0444  Vitals  BP (!) 133/109  MAP (mmHg) 117  BP Method Automatic  Pulse Rate (!) 115  Pulse Rate Source Monitor  ECG Heart Rate (!) 119  Resp (!) 27  Oxygen Therapy  SpO2 97 %  O2 Device Nasal Cannula  O2 Flow Rate (L/min) 3 L/min  MEWS Score  MEWS Temp 0  MEWS Systolic 0  MEWS Pulse 2  MEWS RR 2  MEWS LOC 0  MEWS Score 4  MEWS Score Color Red

## 2021-12-20 NOTE — Progress Notes (Addendum)
Notified by sitter, pt woke up with labored breathing and c/o CP. RN observed pt with dyspnea and holding her chest. O2 applied; 98% 3L. PRN lopressor given for HR >120. Pt stated chest felt sore and yes to tightness and pressure. MD paged. EKG obtained. Orders given.  Pt stated pressure and tightness resolved but still feels soreness. See flowsheet for VS.

## 2021-12-20 NOTE — Progress Notes (Addendum)
  Progress Note   Patient: Ann [name] JOA:[id number] DOB: 1920-05-02 DOA: 12/19/2021     0 DOS: the patient was seen and examined on 12/20/2021   Brief hospital course: Ms. Pricsilla Lindvall is a 88 year old female with history of atrial fibrillation currently on Eliquis, hypertension, heart failure preserved ejection fraction, memory loss, hypothyroid, who presents emergency department from [facility] clinic cardiology clinic for chief concerns of atrial fibrillation with RVR. Patient also has significant leg edema, chest x-ray showed mild pulmonary edema, elevated BNP.  Patient also had shortness of breath and orthopnea.  Consistent with acute on chronic diastolic congestive heart failure. Patient was continued on metoprolol, added diltiazem.  Also started on IV Lasix.  Assessment and Plan: Chronic atrial fibrillation with RVR. Acute on chronic diastolic congestive heart failure. Elevated troponin secondary to A-fib and congestive heart failure. Moderate mitral valve regurgitation. Patient blood pressure is stable, heart rate is still fast, continue metoprolol, add diltiazem.  Continue anticoagulation with Eliquis. Patient also has evidence volume overload, with shortness of breath, increased edema.  Chest x-ray also showed evidence of pulmonary edema, elevated BNP.  Reviewed echocardiogram performed 2 months ago, ejection fraction was 65 to 70% with moderate mitral regurgitation and diastolic dysfunction.  Patient has acute on chronic diastolic congestive heart failure. We will start IV Lasix.  Essential hypertension. Patient currently on metoprolol 50 mg twice a day, added diltiazem.  Alzheimer's dementia. Patient has significant confusion.  But no agitation.  Memory has been deteriorating over the last few months.  Patient will need to follow-up with PCP as outpatient.  1605. Patient had a pauses of 2-4 seconds. Will discontinue diltiazem, told RN can hold evening dose of metoprolol if still  present. Dr. Bethanie Dicker is aware.    Subjective:  Patient still has leg edema and short of breath with exertion.   Physical Exam: Vitals:   12/20/21 0444 12/20/21 0458 12/20/21 0500 12/20/21 0632  BP: (!) 133/109     Pulse: (!) 115 95    Resp: (!) 27  (!) 23   Temp:      TempSrc:      SpO2: 97%     Weight:    72.4 kg  Height:       General exam: Appears calm and comfortable  Respiratory system: Some crackles in the base. Respiratory effort normal. Cardiovascular system: Irregular and tachycardic. no JVD, murmurs, rubs, gallops or clicks.  Gastrointestinal system: Abdomen is nondistended, soft and nontender. No organomegaly or masses felt. Normal bowel sounds heard. Central nervous system: Alert and oriented x1. No focal neurological deficits. Extremities: 1+ bilateral lower leg edema Skin: No rashes, lesions or ulcers   Data Reviewed:  Reviewed prior echocardiogram, reviewed chest x-ray, reviewed all lab results.  Family Communication: Updated at bedside.  Disposition: Status is: Observation The patient will require care spanning > 2 midnights and should be moved to inpatient because: Severity of disease, IV treatment.  Planned Discharge Destination: Home    Time spent: 55 minutes  Author: Marrion Coy, MD 12/20/2021 10:37 AM  For on call review [email or web address].

## 2021-12-20 NOTE — TOC Initial Note (Signed)
Transition of Care [facility]) - Initial/Assessment Note    Patient Details  Name: Ann [name] MRN: [id number] Date of Birth: 1919-11-11  Transition of Care [facility]) CM/SW Contact:    Luvenia Redden, RN Phone Number:[phone] 12/20/2021, 2:20 PM  Clinical Narrative:                 Spoke with pt's daughter Domingo Pulse concerning education on HF and Atrial Fibrillation. Will also include this information in AVS packet for review. Pt lives with her daughter and Tyler Aas is the primary care person who takes pt to all her medical appointments and obtain all her medications . Pt has a Eisel, cane, WC and 3-1 commode with recent falls or related issues. No presented needs at this time.  TOC will continue to follow up accordingly with any recommended needs.   Expected Discharge Plan: Home/Self Care Barriers to Discharge: Continued Medical Work up   Patient Goals and CMS Choice        Expected Discharge Plan and Services Expected Discharge Plan: Home/Self Care       Living arrangements for the past 2 months: Single Family Home                                      Prior Living Arrangements/Services Living arrangements for the past 2 months: Single Family Home Lives with:: Adult Children Patient language and need for interpreter reviewed:: Yes Do you feel safe going back to the place where you live?: Yes      Need for Family Participation in Patient Care: Yes (Comment) Care giver support system in place?: Yes (comment)   Criminal Activity/Legal Involvement Pertinent to Current Situation/Hospitalization: No - Comment as needed  Activities of Daily Living      Permission Sought/Granted   Permission granted to share information with : Yes, Verbal Permission Granted              Emotional Assessment Appearance:: Appears stated age Attitude/Demeanor/Rapport: Engaged Affect (typically observed): Accepting Orientation: : Oriented to Self Alcohol / Substance  Use: Not Applicable    Admission diagnosis:  Paroxysmal atrial fibrillation (HCC) [I48.0] Atrial fibrillation with RVR (HCC) [I48.91] Acute congestive heart failure, unspecified heart failure type (HCC) [I50.9] Atrial fibrillation with rapid ventricular response (HCC) [I48.91] Patient Active Problem List   Diagnosis Date Noted   Acute on chronic diastolic CHF (congestive heart failure) (HCC) 12/20/2021   Chronic atrial fibrillation with RVR (HCC) 12/20/2021   Dementia without behavioral disturbance (HCC) 12/20/2021   Moderate mitral regurgitation 12/20/2021   Atrial fibrillation with rapid ventricular response (HCC) 12/20/2021   Atrial fibrillation with RVR (HCC) 12/19/2021   Myocardial injury 12/19/2021   Leg swelling 12/19/2021   Acute CHF (congestive heart failure) (HCC) 10/11/2021   Arrhythmia 10/11/2021   COVID-19 virus infection 10/11/2021   Elevated troponin 10/11/2021   Hyperbilirubinemia 10/11/2021   Aortic valve disease 01/30/2019   Asymmetric septal hypertrophy (HCC) 08/30/2017   Benign essential hypertension 08/07/2015   Hypothyroid 08/24/2013   Hyperlipidemia 08/24/2013   PCP:  Jaclyn Shaggy, MD Pharmacy:   [facility] - [place], [place] - [address] [place] [address] Phone: [phone] Fax: [phone]  [facility] [address] [place] - [address] [place] [address] Phone: [phone] Fax: [phone]     Social Determinants of Health (SDOH) Interventions    Readmission  Risk Interventions     No data to display

## 2021-12-20 NOTE — Consult Note (Signed)
[facility] Consultation Note  Patient ID: Ann [name], MRN: [id number], DOB/AGE: [birth year] 88 y.o. Admit date: 12/19/2021   Date of Consult: 12/20/2021 Primary Physician: Jaclyn Shaggy, MD Primary Cardiologist: Lady Gary  Chief Complaint:  Chief Complaint  Patient presents with   Atrial Fibrillation   Reason for Consult:  Atrial fibrillation  HPI: 88 y.o. female with known hypertension who has had a significant palpitations irregular heartbeat and shortness of breath and weakness over the last few days and was seen in the emergency room with atrial fibrillation with rapid ventricular rate.  She was placed on appropriate medication management and had improvements of heart rate control currently to 102 bpm at rest.  She had previously been on Eliquis due to atrial fibrillation in the past and currently is stable with that without evidence of bleeding complications.  The patient also has been given metoprolol for good heart rate control 2.  There has been some mild evidence of congestive heart failure with her rapid ventricular rate with vascular congestion of her lungs by chest x-ray.  She has received intravenous Lasix which has helped at this time.  Currently she is stable Past Medical History:  Diagnosis Date   Aortic stenosis    CHF (congestive heart failure) (HCC)    COPD (chronic obstructive pulmonary disease) (HCC)    Dementia (HCC)    High cholesterol    Hypertension    Thyroid disease       Surgical History:  Past Surgical History:  Procedure Laterality Date   APPENDECTOMY     JOINT REPLACEMENT     THYROID SURGERY       Home Meds: Prior to Admission medications   Medication Sig Start Date End Date Taking? Authorizing Provider  apixaban (ELIQUIS) 5 MG TABS tablet Take 1 tablet (5 mg total) by mouth 2 (two) times daily. 12/18/21  Yes Hackney, Tina A, FNP  BREO ELLIPTA 100-25 MCG/ACT AEPB 1 puff daily. 09/24/21  Yes [provider]  furosemide (LASIX)  20 MG tablet Take 1 tablet (20 mg total) by mouth daily. 10/15/21  Yes Charise Killian, MD  isosorbide mononitrate (IMDUR) 30 MG 24 hr tablet Take 30 mg by mouth daily. 05/08/21  Yes [provider]  levothyroxine (SYNTHROID, LEVOTHROID) 50 MCG tablet Take 50 mcg by mouth daily.   Yes [provider]  metoprolol tartrate (LOPRESSOR) 50 MG tablet Take 1 tablet (50 mg total) by mouth 2 (two) times daily. 12/18/21  Yes Hackney, Inetta Fermo A, FNP  acetaminophen (TYLENOL) 325 MG tablet Take 650 mg by mouth every 6 (six) hours as needed for headache. For pain    [provider]  albuterol (PROVENTIL HFA;VENTOLIN HFA) 108 (90 Base) MCG/ACT inhaler Inhale 2 puffs into the lungs every 6 (six) hours as needed for wheezing or shortness of breath. Dispense 1 spacer to use with the inhaler 07/15/15   Arnaldo Natal, MD  amLODipine (NORVASC) 2.5 MG tablet Take 2.5 mg by mouth daily. Patient not taking: Reported on 12/18/2021 07/01/21   [provider]  cyanocobalamin 1000 MCG tablet Take by mouth. Patient not taking: Reported on 12/18/2021    [provider]  losartan (COZAAR) 50 MG tablet Take 50 mg by mouth daily. Patient not taking: Reported on 12/18/2021 08/07/15   [provider]    Inpatient Medications:   apixaban  5 mg Oral BID   diltiazem  180 mg Oral Daily   fluticasone furoate-vilanterol  1 puff Inhalation Daily  furosemide  40 mg Intravenous Q12H   isosorbide mononitrate  30 mg Oral Daily   levothyroxine  50 mcg Oral Q0600   metoprolol tartrate  50 mg Oral BID     Allergies:  Allergies  Allergen Reactions   Penicillins     Social History   Socioeconomic History   Marital status: Married    Spouse name: Not on file   Number of children: Not on file   Years of education: Not on file   Highest education level: Not on file  Occupational History   Not on file  Tobacco Use   Smoking status: Never   Smokeless tobacco: Not on file   Substance and Sexual Activity   Alcohol use: Not on file   Drug use: Not on file   Sexual activity: Not on file  Other Topics Concern   Not on file  Social History Narrative   Not on file   Social Determinants of Health   Financial Resource Strain: Not on file  Food Insecurity: Not on file  Transportation Needs: Not on file  Physical Activity: Not on file  Stress: Not on file  Social Connections: Not on file  Intimate Partner Violence: Not on file     History reviewed. No pertinent family history.   Review of Systems Positive for shortness of breath Negative for: General:  chills, fever, night sweats or weight changes.  Cardiovascular: PND orthopnea syncope dizziness  Dermatological skin lesions rashes Respiratory: Cough congestion Urologic: Frequent urination urination at night and hematuria Abdominal: negative for nausea, vomiting, diarrhea, bright red blood per rectum, melena, or hematemesis Neurologic: negative for visual changes, and/or hearing changes  All other systems reviewed and are otherwise negative except as noted above.  Labs: No results for input(s): "CKTOTAL", "CKMB", "TROPONINI" in the last 72 hours. Lab Results  Component Value Date   WBC 4.3 12/20/2021   HGB 11.8 (L) 12/20/2021   HCT 36.9 12/20/2021   MCV 97.4 12/20/2021   PLT 269 12/20/2021    Recent Labs  Lab 12/20/21 0446  NA 140  K 3.7  CL 108  CO2 24  BUN 33*  CREATININE 0.91  CALCIUM 8.7*  GLUCOSE 99   Lab Results  Component Value Date   CHOL 177 08/25/2011   HDL 88 (H) 08/25/2011   LDLCALC 83 08/25/2011   TRIG 31 08/25/2011   No results found for: "DDIMER"  Radiology/Studies:  DG Chest Port 1 View  Result Date: 12/19/2021 CLINICAL DATA:  Atrial fibrillation with rapid ventricular response. Fatigue. EXAM: PORTABLE CHEST 1 VIEW COMPARISON:  Radiographs earlier the same date and 10/11/2021. FINDINGS: 1508 hours. Stable cardiomegaly and aortic atherosclerosis. There is  increased vascular congestion with possible mild pulmonary edema. No confluent airspace opacity, pneumothorax or significant pleural effusion. The bones are demineralized without acute osseous findings. Telemetry leads overlie the chest. IMPRESSION: Cardiomegaly with increased vascular congestion and possible mild pulmonary edema. Electronically Signed   By: Carey Bullocks M.D.   On: 12/19/2021 15:17   DG Chest 2 View  Result Date: 12/19/2021 CLINICAL DATA:  New onset atrial fibrillation EXAM: CHEST - 2 VIEW COMPARISON:  10/11/2021 FINDINGS: The heart is mildly enlarged.  No pulmonary vascular congestion. The descending thoracic aorta is dilated measuring up to 4.9 cm. Patchy opacities at the left lung base likely due to atelectasis. Lungs otherwise clear. IMPRESSION: 1. Mild cardiomegaly. 2. Dilated descending thoracic aorta measuring up to 4.9 cm can be better evaluated with CT angiography of the chest,  if clinically appropriate. Electronically Signed   By: Acquanetta Belling M.D.   On: 12/19/2021 11:20    EKG: Atrial fibrillation with nonspecific ST changes and preventricular contractions  Weights: Filed Weights   12/19/21 1037 12/20/21 0632  Weight: 73 kg 72.4 kg     Physical Exam: Blood pressure (!) 131/100, pulse 75, temperature 97.8 F (36.6 C), temperature source Oral, resp. rate 20, height 5\' 7"  (1.702 m), weight 72.4 kg, SpO2 98 %. Body mass index is 25 kg/m. General: Well developed, well nourished, in no acute distress. Head eyes ears nose throat: Normocephalic, atraumatic, sclera non-icteric, no xanthomas, nares are without discharge. No apparent thyromegaly and/or mass  Lungs: Normal respiratory effort.  Few wheezes, no rales, no rhonchi.  Heart: Irregular with normal S1 S2. no murmur gallop, no rub, PMI is normal size and placement, carotid upstroke normal without bruit, jugular venous pressure is normal Abdomen: Soft, non-tender, non-distended with normoactive bowel sounds. No  hepatomegaly. No rebound/guarding. No obvious abdominal masses. Abdominal aorta is normal size without bruit Extremities: Trace edema. no cyanosis, no clubbing, no ulcers  Peripheral : 2+ bilateral upper extremity pulses, 2+ bilateral femoral pulses, 2+ bilateral dorsal pedal pulse Neuro: Alert and oriented. No facial asymmetry. No focal deficit. Moves all extremities spontaneously. Musculoskeletal: Normal muscle tone without kyphosis Psych:  Responds to questions appropriately with a normal affect.    Assessment: 88 year old female with paroxysmal nonvalvular atrial fibrillation with rapid ventricular rate and hypertension with acute diastolic dysfunction congestive heart failure due to rapid rate currently improving on appropriate medication management without evidence of acute coronary syndrome  Plan: 1.  Continue diltiazem for heart rate control of atrial fibrillation and using low-dose of metoprolol as well for goal heart rate between 60 and 90 bpm.  Adjustments will ensue 2.  Continuation of anticoagulation for further risk reduction in stroke with atrial fibrillation at current dose 5 mg twice per day 3.  No further cardiac diagnostics necessary at this time due to no current evidence of acute coronary syndrome 4.  Begin ambulation and follow-up for improvements of symptoms with medication management.  Signed, 83 M.D. [facility] 12/20/2021, 12:35 PM

## 2021-12-21 DIAGNOSIS — I5033 Acute on chronic diastolic (congestive) heart failure: Secondary | ICD-10-CM | POA: Diagnosis not present

## 2021-12-21 DIAGNOSIS — F0390 Unspecified dementia without behavioral disturbance: Secondary | ICD-10-CM

## 2021-12-21 DIAGNOSIS — I4891 Unspecified atrial fibrillation: Secondary | ICD-10-CM | POA: Diagnosis not present

## 2021-12-21 LAB — ECHOCARDIOGRAM COMPLETE
AR max vel: 1.14 cm2
AV Area VTI: 0.98 cm2
AV Area mean vel: 1.05 cm2
AV Mean grad: 9 mmHg
AV Peak grad: 14.5 mmHg
Ao pk vel: 1.91 m/s
Area-P 1/2: 3.87 cm2
Calc EF: 45.5 %
Height: 67 in
P 1/2 time: 764 msec
S' Lateral: 3.66 cm
Single Plane A2C EF: 34.8 %
Single Plane A4C EF: 59.7 %
Weight: 2553.81 oz

## 2021-12-21 MED ORDER — TORSEMIDE 20 MG PO TABS
20.0000 mg | ORAL_TABLET | Freq: Every day | ORAL | Status: DC
  Administered 2021-12-22: 20 mg via ORAL
  Filled 2021-12-21: qty 1

## 2021-12-21 NOTE — Progress Notes (Signed)
   12/21/21 1840  Assess: MEWS Score  BP 127/90  MAP (mmHg) 100  Pulse Rate (!) 133  Assess: MEWS Score  MEWS Temp 0  MEWS Systolic 0  MEWS Pulse 3  MEWS RR 0  MEWS LOC 0  MEWS Score 3  MEWS Score Color Yellow  Assess: if the MEWS score is Yellow or Red  Were vital signs taken at a resting state? Yes  Focused Assessment Change from prior assessment (see assessment flowsheet)  Does the patient meet 2 or more of the SIRS criteria? No  MEWS guidelines implemented *See Row Information* No, vital signs rechecked  Treat  MEWS Interventions Administered prn meds/treatments (metoprolol)  Pain Scale 0-10  Pain Score 0  Notify: Charge Nurse/RN  Name of Charge Nurse/RN Notified Stephanie, RN  Date Charge Nurse/RN Notified 12/21/21  Time Charge Nurse/RN Notified 1848  Document  Patient Outcome Stabilized after interventions  Assess: SIRS CRITERIA  SIRS Temperature  0  SIRS Pulse 1  SIRS Respirations  0  SIRS WBC 0  SIRS Score Sum  1

## 2021-12-21 NOTE — Progress Notes (Signed)
[facility] Encounter Note  Patient: Ann [name] / Admit Date: 12/19/2021 / Date of Encounter: 12/21/2021, 4:09 PM   Subjective: Overall patient feels well today with no evidence of significant symptoms of dizziness weakness passout spells or congestive heart failure.  Patient has had variable heart rate control with some rapid ventricular rate early in the hospitalization and then some pauses of 2 to 3 seconds but nothing greater than 3 seconds.  The diltiazem was held and her average heart rate is under 110 bpm this time which is reasonable.  She remains on oral torsemide which seems to be working relatively well.  No evidence of bleeding complications with anticoagulation.  Review of Systems: Positive for: None Negative for: Vision change, hearing change, syncope, dizziness, nausea, vomiting,diarrhea, bloody stool, stomach pain, cough, congestion, diaphoresis, urinary frequency, urinary pain,skin lesions, skin rashes Others previously listed  Objective: Telemetry: Atrial fibrillation with variable rate Physical Exam: Blood pressure 107/69, pulse 63, temperature 98.1 F (36.7 C), temperature source Oral, resp. rate 18, height 5\' 7"  (1.702 m), weight 72.4 kg, SpO2 97 %. Body mass index is 25 kg/m. General: Well developed, well nourished, in no acute distress. Head: Normocephalic, atraumatic, sclera non-icteric, no xanthomas, nares are without discharge. Neck: No apparent masses Lungs: Normal respirations with no wheezes, no rhonchi, no rales , no crackles   Heart: Irregular rate and rhythm, normal S1 S2, no murmur, no rub, no gallop, PMI is normal size and placement, carotid upstroke normal without bruit, jugular venous pressure normal Abdomen: Soft, non-tender, non-distended with normoactive bowel sounds. No hepatosplenomegaly. Abdominal aorta is normal size without bruit Extremities: Trace edema, no clubbing, no cyanosis, no ulcers,  Peripheral: 2+ radial, 2+  femoral, 2+ dorsal pedal pulses Neuro: Alert and oriented. Moves all extremities spontaneously. Psych:  Responds to questions appropriately with a normal affect.   Intake/Output Summary (Last 24 hours) at 12/21/2021 1609 Last data filed at 12/21/2021 1233 Gross per 24 hour  Intake 100 ml  Output 720 ml  Net -620 ml    Inpatient Medications:   apixaban  5 mg Oral BID   fluticasone furoate-vilanterol  1 puff Inhalation Daily   isosorbide mononitrate  30 mg Oral Daily   levothyroxine  50 mcg Oral Q0600   metoprolol tartrate  50 mg Oral BID   [START ON 12/22/2021] torsemide  20 mg Oral Daily   Infusions:   Labs: Recent Labs    12/19/21 1049 12/19/21 1628 12/20/21 0446  NA 141  --  140  K 4.4  --  3.7  CL 107  --  108  CO2 24  --  24  GLUCOSE 95  --  99  BUN 27*  --  33*  CREATININE 0.88  --  0.91  CALCIUM 9.0  --  8.7*  MG 2.3 2.1  --   PHOS  --  4.4  --    No results for input(s): "AST", "ALT", "ALKPHOS", "BILITOT", "PROT", "ALBUMIN" in the last 72 hours. Recent Labs    12/19/21 1049 12/20/21 0446  WBC 4.2 4.3  HGB 11.9* 11.8*  HCT 38.2 36.9  MCV 98.5 97.4  PLT 288 269   No results for input(s): "CKTOTAL", "CKMB", "TROPONINI" in the last 72 hours. Invalid input(s): "POCBNP" No results for input(s): "HGBA1C" in the last 72 hours.   Weights: Filed Weights   12/19/21 1037 12/20/21 0632  Weight: 73 kg 72.4 kg     Radiology/Studies:  ECHOCARDIOGRAM COMPLETE  Result Date: 12/21/2021  ECHOCARDIOGRAM REPORT   Patient Name:   Ann [name] Date of Exam: 12/20/2021 Medical Rec #:  [id number]      Height:       67.0 in Accession #:    [id number]     Weight:       159.6 lb Date of Birth:  15-Jul-1919      BSA:          1.837 m Patient Age:    88 years       BP:           133/109 mmHg Patient Gender: F              HR:           79 bpm. Exam Location:  [facility] Procedure: 2D Echo Indications:     Atrial Fibrillation I48.91  History:         Patient has prior history of  Echocardiogram examinations, most                  recent 10/13/2021.  Sonographer:     Kathlen Brunswick RDCS Referring Phys:  [id number] AMY N [facility] Diagnosing Phys: Serafina Royals MD IMPRESSIONS  1. Left ventricular ejection fraction, by estimation, is 60 to 65%. The left ventricle has normal function. The left ventricle has no regional wall motion abnormalities. Left ventricular diastolic parameters were normal.  2. Right ventricular systolic function is normal. The right ventricular size is normal.  3. Left atrial size was mild to moderately dilated.  4. Right atrial size was mild to moderately dilated.  5. The mitral valve is normal in structure. Moderate mitral valve regurgitation.  6. Tricuspid valve regurgitation is moderate to severe.  7. The aortic valve is normal in structure. Aortic valve regurgitation is mild to moderate.  8. Aortic dilatation noted. 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 FINDINGS  Left Ventricle: Left ventricular ejection fraction, by estimation, is 60 to 65%. The left ventricle has normal function. The left ventricle has no regional wall motion abnormalities. The left ventricular internal cavity size was normal in size. There is  no left ventricular hypertrophy. Left ventricular diastolic parameters were normal. Right Ventricle: The right ventricular size is normal. No increase in right ventricular wall thickness. Right ventricular systolic function is normal. Left Atrium: Left atrial size was mild to moderately dilated. Right Atrium: Right atrial size was mild to moderately dilated. Pericardium: There is no evidence of pericardial effusion. Mitral Valve: The mitral valve is normal in structure. Moderate mitral valve regurgitation. Tricuspid Valve: The tricuspid valve is normal in structure. Tricuspid valve regurgitation is moderate to severe. Aortic Valve: The aortic valve is normal in structure. Aortic valve regurgitation is mild to moderate. Aortic  regurgitation PHT measures 764 msec. Aortic valve mean gradient measures 9.0 mmHg. Aortic valve peak gradient measures 14.5 mmHg. Aortic valve area, by VTI measures 0.98 cm. Pulmonic Valve: The pulmonic valve was normal in structure. Pulmonic valve regurgitation is mild. Aorta: Aortic dilatation noted. 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 IAS/Shunts: No atrial level shunt detected by color flow Doppler.  LEFT VENTRICLE PLAX 2D LVIDd:         4.71 cm     Diastology LVIDs:         3.66 cm     LV e' medial:    4.79 cm/s LV PW:         1.27 cm     LV  E/e' medial:  15.3 LV IVS:        1.02 cm     LV e' lateral:   7.72 cm/s LVOT diam:     1.60 cm     LV E/e' lateral: 9.5 LV SV:         37 LV SV Index:   20 LVOT Area:     2.01 cm  LV Volumes (MOD) LV vol d, MOD A2C: 46.5 ml LV vol d, MOD A4C: 87.9 ml LV vol s, MOD A2C: 30.3 ml LV vol s, MOD A4C: 35.4 ml LV SV MOD A2C:     16.2 ml LV SV MOD A4C:     87.9 ml LV SV MOD BP:      29.0 ml RIGHT VENTRICLE TAPSE (M-mode): 2.1 cm LEFT ATRIUM           Index        RIGHT ATRIUM           Index LA diam:      5.00 cm 2.72 cm/m   RA Area:     21.60 cm LA Vol (A2C): 53.7 ml 29.23 ml/m  RA Volume:   73.60 ml  40.06 ml/m LA Vol (A4C): 95.0 ml 51.70 ml/m  AORTIC VALVE                     PULMONIC VALVE AV Area (Vmax):    1.14 cm      PV Vmax:          0.65 m/s AV Area (Vmean):   1.05 cm      PV Peak grad:     1.7 mmHg AV Area (VTI):     0.98 cm      PR End Diast Vel: 7.18 msec AV Vmax:           190.50 cm/s AV Vmean:          142.000 cm/s AV VTI:            0.377 m AV Peak Grad:      14.5 mmHg AV Mean Grad:      9.0 mmHg LVOT Vmax:         108.00 cm/s LVOT Vmean:        73.900 cm/s LVOT VTI:          0.183 m LVOT/AV VTI ratio: 0.49 AI PHT:            764 msec  AORTA Ao Root diam: 3.10 cm Ao Asc diam:  4.60 cm MITRAL VALVE               TRICUSPID VALVE MV Area (PHT): 3.87 cm    TV Peak grad:   30.2 mmHg MV Decel Time: 196 msec    TV Vmax:         2.75 m/s MV E velocity: 73.30 cm/s                            SHUNTS                            Systemic VTI:  0.18 m                            Systemic Diam: 1.60 cm Arnoldo Hooker MD Electronically signed by Arnoldo Hooker MD Signature Date/Time: 12/21/2021/4:05:04 PM  Final    US Venous Img Lower Bilateral (DVT)  Result Date: 12/20/2021 CLINICAL DATA:  Leg swelling EXAM: BILATERAL LOWER EXTREMITY VENOUS DOPPLER ULTRASOUND TECHNIQUE: Gray-scale sonography with compression, as well as color and duplex ultrasound, were performed to evaluate the deep venous system(s) from the level of the common femoral vein through the popliteal and proximal calf veins. COMPARISON:  None available FINDINGS: VENOUS Normal compressibility of the common femoral, superficial femoral, and popliteal veins, as well as the visualized calf veins. Visualized portions of profunda femoral vein and great saphenous vein unremarkable. No filling defects to suggest DVT on grayscale or color Doppler imaging. Doppler waveforms show normal direction of venous flow, normal respiratory plasticity and response to augmentation. OTHER None. Limitations: Limited visualization of the calf veins. IMPRESSION: No lower extremity DVT.  Limited visualization of calf veins. Electronically Signed   By: Acquanetta Belling M.D.   On: 12/20/2021 17:21   DG Chest Port 1 View  Result Date: 12/19/2021 CLINICAL DATA:  Atrial fibrillation with rapid ventricular response. Fatigue. EXAM: PORTABLE CHEST 1 VIEW COMPARISON:  Radiographs earlier the same date and 10/11/2021. FINDINGS: 1508 hours. Stable cardiomegaly and aortic atherosclerosis. There is increased vascular congestion with possible mild pulmonary edema. No confluent airspace opacity, pneumothorax or significant pleural effusion. The bones are demineralized without acute osseous findings. Telemetry leads overlie the chest. IMPRESSION: Cardiomegaly with increased vascular congestion and possible mild pulmonary  edema. Electronically Signed   By: Carey Bullocks M.D.   On: 12/19/2021 15:17   DG Chest 2 View  Result Date: 12/19/2021 CLINICAL DATA:  New onset atrial fibrillation EXAM: CHEST - 2 VIEW COMPARISON:  10/11/2021 FINDINGS: The heart is mildly enlarged.  No pulmonary vascular congestion. The descending thoracic aorta is dilated measuring up to 4.9 cm. Patchy opacities at the left lung base likely due to atelectasis. Lungs otherwise clear. IMPRESSION: 1. Mild cardiomegaly. 2. Dilated descending thoracic aorta measuring up to 4.9 cm can be better evaluated with CT angiography of the chest, if clinically appropriate. Electronically Signed   By: Acquanetta Belling M.D.   On: 12/19/2021 11:20     Assessment and Recommendation  88 y.o. female with known paroxysmal nonvalvular atrial fibrillation with rapid ventricular rate now with sick sinus syndrome type issues now on medication management schedule which appears to balance her issues with an average heart rate of approximately 100 to 110 bpm.  Would not likely change current medical regimen unless further heart block or other issues were occur and at that time would consider the possibility of a Micra pacemaker placement. 1.  Continuation of metoprolol at current dosage with abstinence of diltiazem at this time due to concerns of sick sinus syndrome and pauses 2.  Continue anticoagulation for further risk reduction stroke with atrial fibrillation 3.  No further cardiac diagnostics necessary at this time 4.  Okay for ambulation at this time following for improvements of symptoms but if no significant changes in heart rate control would consider discharged home  Signed, Arnoldo Hooker M.D. FACC

## 2021-12-21 NOTE — Progress Notes (Signed)
  Progress Note   Patient: Ann [name] YQM:[id number] DOB: 1919/06/18 DOA: 12/19/2021     1 DOS: the patient was seen and examined on 12/21/2021   Brief hospital course: Ann [name] is a 88 year old female with history of atrial fibrillation currently on Eliquis, hypertension, heart failure preserved ejection fraction, memory loss, hypothyroid, who presents emergency department from [facility] clinic cardiology clinic for chief concerns of atrial fibrillation with RVR. Patient also has significant leg edema, chest x-ray showed mild pulmonary edema, elevated BNP.  Patient also had shortness of breath and orthopnea.  Consistent with acute on chronic diastolic congestive heart failure. Patient was continued on metoprolol, added diltiazem.  Also started on IV Lasix. 7/15.  Patient developed AV block with a pause of 4 seconds, diltiazem discontinued.  Assessment and Plan: Chronic atrial fibrillation with RVR. AV block with pauses up to 4 seconds. Acute on chronic diastolic congestive heart failure. Elevated troponin secondary to A-fib and congestive heart failure. Moderate mitral valve regurgitation. Patient volume status much improved, change diuretics to oral torsemide 20 mg daily. Telemetry showed AV block with pauses up to 4 seconds happened multiple times.  Did not recur today.  He was initially placed on diltiazem, which has been discontinued yesterday.  Heart rate has been running 100-110.  Will await decision from cardiology about the option of treatment for this condition. If pacemaker is not considered, patient probably need to go home with a goal of a higher heart rate.  Essential hypertension. Patient currently on metoprolol 50 mg twice a day.  Alzheimer's dementia. Condition stable, patient is confused, no agitation.      Subjective:  Patient feels well, baseline confusion.  Denies any short of breath.  Physical Exam: Vitals:   12/21/21 0006 12/21/21 0316 12/21/21 0803  12/21/21 1134  BP: (!) 123/96 (!) 145/94 (!) 141/97 107/69  Pulse: 62  65 63  Resp: 19 20 17 18   Temp: 98 F (36.7 C) 98.2 F (36.8 C) 97.8 F (36.6 C) 98.1 F (36.7 C)  TempSrc: Oral Oral  Oral  SpO2: 94% 95% 97% 97%  Weight:      Height:       General exam: Appears calm and comfortable  Respiratory system: Clear to auscultation. Respiratory effort normal. Cardiovascular system: Irregularly irregular, tachycardic. No JVD, murmurs, rubs, gallops or clicks. No pedal edema. Gastrointestinal system: Abdomen is nondistended, soft and nontender. No organomegaly or masses felt. Normal bowel sounds heard. Central nervous system: Alert and oriented x1. No focal neurological deficits. Extremities: Symmetric 5 x 5 power. Skin: No rashes, lesions or ulcers   Data Reviewed:  Lab results reviewed.  Family Communication: Daughter updated at bedside  Disposition: Status is: Inpatient Remains inpatient appropriate because: Severity of disease  Planned Discharge Destination: Home with Home Health    Time spent: 35 minutes  Author: , MD 12/21/2021 12:18 PM  For on call review [email or web address].

## 2021-12-22 DIAGNOSIS — I482 Chronic atrial fibrillation, unspecified: Secondary | ICD-10-CM | POA: Diagnosis not present

## 2021-12-22 DIAGNOSIS — F0390 Unspecified dementia without behavioral disturbance: Secondary | ICD-10-CM | POA: Diagnosis not present

## 2021-12-22 DIAGNOSIS — I5033 Acute on chronic diastolic (congestive) heart failure: Secondary | ICD-10-CM | POA: Diagnosis not present

## 2021-12-22 MED ORDER — METOPROLOL TARTRATE 25 MG PO TABS
25.0000 mg | ORAL_TABLET | Freq: Once | ORAL | Status: AC
  Administered 2021-12-22: 25 mg via ORAL
  Filled 2021-12-22: qty 1

## 2021-12-22 MED ORDER — METOPROLOL TARTRATE 50 MG PO TABS: ORAL_TABLET | ORAL | 0 refills | Status: DC

## 2021-12-22 NOTE — Progress Notes (Signed)
[facility] Encounter Note  Patient: Bilan Tedesco / Admit Date: 12/19/2021 / Date of Encounter: 12/22/2021, 12:42 PM   Subjective: Overall patient feels well today with no evidence of significant symptoms of dizziness weakness passout spells or congestive heart failure.  Patient has had variable heart rate control with some rapid ventricular rate early in the hospitalization and then some pauses of 2 to 3 seconds but nothing greater than 3 seconds.  The diltiazem was held and her average heart rate is under 110 bpm this time which is reasonable.  She remains on oral torsemide which seems to be working relatively well.  No evidence of bleeding complications with anticoagulation.  Review of Systems: Positive for: None Negative for: Vision change, hearing change, syncope, dizziness, nausea, vomiting,diarrhea, bloody stool, stomach pain, cough, congestion, diaphoresis, urinary frequency, urinary pain,skin lesions, skin rashes Others previously listed  Objective: Telemetry: Atrial fibrillation with variable rate Physical Exam: Blood pressure (!) 111/99, pulse (!) 50, temperature 97.6 F (36.4 C), temperature source Oral, resp. rate 20, height 5\' 7"  (1.702 m), weight 72.8 kg, SpO2 97 %. Body mass index is 25.14 kg/m. General: Well developed, well nourished, in no acute distress. Head: Normocephalic, atraumatic, sclera non-icteric, no xanthomas, nares are without discharge. Neck: No apparent masses Lungs: Normal respirations with no wheezes, no rhonchi, no rales , no crackles   Heart: Irregular rate and rhythm, normal S1 S2, no murmur, no rub, no gallop, PMI is normal size and placement, carotid upstroke normal without bruit, jugular venous pressure normal Abdomen: Soft, non-tender, non-distended with normoactive bowel sounds. No hepatosplenomegaly. Abdominal aorta is normal size without bruit Extremities: Trace edema, no clubbing, no cyanosis, no ulcers,  Peripheral: 2+  radial, 2+ femoral, 2+ dorsal pedal pulses Neuro: Alert and oriented. Moves all extremities spontaneously. Psych:  Responds to questions appropriately with a normal affect.   Intake/Output Summary (Last 24 hours) at 12/22/2021 1242 Last data filed at 12/22/2021 0300 Gross per 24 hour  Intake --  Output 750 ml  Net -750 ml     Inpatient Medications:   apixaban  5 mg Oral BID   fluticasone furoate-vilanterol  1 puff Inhalation Daily   isosorbide mononitrate  30 mg Oral Daily   levothyroxine  50 mcg Oral Q0600   metoprolol tartrate  25 mg Oral Once   metoprolol tartrate  50 mg Oral BID   torsemide  20 mg Oral Daily   Infusions:   Labs: Recent Labs    12/19/21 1628 12/20/21 0446  NA  --  140  K  --  3.7  CL  --  108  CO2  --  24  GLUCOSE  --  99  BUN  --  33*  CREATININE  --  0.91  CALCIUM  --  8.7*  MG 2.1  --   PHOS 4.4  --     No results for input(s): "AST", "ALT", "ALKPHOS", "BILITOT", "PROT", "ALBUMIN" in the last 72 hours. Recent Labs    12/20/21 0446  WBC 4.3  HGB 11.8*  HCT 36.9  MCV 97.4  PLT 269    No results for input(s): "CKTOTAL", "CKMB", "TROPONINI" in the last 72 hours. Invalid input(s): "POCBNP" No results for input(s): "HGBA1C" in the last 72 hours.   Weights: Filed Weights   12/19/21 1037 12/20/21 0632 12/22/21 0352  Weight: 73 kg 72.4 kg 72.8 kg     Radiology/Studies:  ECHOCARDIOGRAM COMPLETE  Result Date: 12/21/2021    ECHOCARDIOGRAM REPORT   Patient Name:  Alvino Chapel Date of Exam: 12/20/2021 Medical Rec #:  [id number]      Height:       67.0 in Accession #:    [id number]     Weight:       159.6 lb Date of Birth:  [birth year]      BSA:          1.837 m Patient Age:    89 years       BP:           133/109 mmHg Patient Gender: F              HR:           79 bpm. Exam Location:  [facility] Procedure: 2D Echo Indications:     Atrial Fibrillation I48.91  History:         Patient has prior history of Echocardiogram examinations, most                   recent 10/13/2021.  Sonographer:     Overton Mam RDCS Referring Phys:  [id number] AMY N COX Diagnosing Phys: Arnoldo Hooker MD IMPRESSIONS  1. Left ventricular ejection fraction, by estimation, is 60 to 65%. The left ventricle has normal function. The left ventricle has no regional wall motion abnormalities. Left ventricular diastolic parameters were normal.  2. Right ventricular systolic function is normal. The right ventricular size is normal.  3. Left atrial size was mild to moderately dilated.  4. Right atrial size was mild to moderately dilated.  5. The mitral valve is normal in structure. Moderate mitral valve regurgitation.  6. Tricuspid valve regurgitation is moderate to severe.  7. The aortic valve is normal in structure. Aortic valve regurgitation is mild to moderate.  8. Aortic dilatation noted. 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 FINDINGS  Left Ventricle: Left ventricular ejection fraction, by estimation, is 60 to 65%. The left ventricle has normal function. The left ventricle has no regional wall motion abnormalities. The left ventricular internal cavity size was normal in size. There is  no left ventricular hypertrophy. Left ventricular diastolic parameters were normal. Right Ventricle: The right ventricular size is normal. No increase in right ventricular wall thickness. Right ventricular systolic function is normal. Left Atrium: Left atrial size was mild to moderately dilated. Right Atrium: Right atrial size was mild to moderately dilated. Pericardium: There is no evidence of pericardial effusion. Mitral Valve: The mitral valve is normal in structure. Moderate mitral valve regurgitation. Tricuspid Valve: The tricuspid valve is normal in structure. Tricuspid valve regurgitation is moderate to severe. Aortic Valve: The aortic valve is normal in structure. Aortic valve regurgitation is mild to moderate. Aortic regurgitation PHT measures 764 msec. Aortic valve mean  gradient measures 9.0 mmHg. Aortic valve peak gradient measures 14.5 mmHg. Aortic valve area, by VTI measures 0.98 cm. Pulmonic Valve: The pulmonic valve was normal in structure. Pulmonic valve regurgitation is mild. Aorta: Aortic dilatation noted. 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 IAS/Shunts: No atrial level shunt detected by color flow Doppler.  LEFT VENTRICLE PLAX 2D LVIDd:         4.71 cm     Diastology LVIDs:         3.66 cm     LV e' medial:    4.79 cm/s LV PW:         1.27 cm     LV E/e' medial:  15.3 LV IVS:  1.02 cm     LV e' lateral:   7.72 cm/s LVOT diam:     1.60 cm     LV E/e' lateral: 9.5 LV SV:         37 LV SV Index:   20 LVOT Area:     2.01 cm  LV Volumes (MOD) LV vol d, MOD A2C: 46.5 ml LV vol d, MOD A4C: 87.9 ml LV vol s, MOD A2C: 30.3 ml LV vol s, MOD A4C: 35.4 ml LV SV MOD A2C:     16.2 ml LV SV MOD A4C:     87.9 ml LV SV MOD BP:      29.0 ml RIGHT VENTRICLE TAPSE (M-mode): 2.1 cm LEFT ATRIUM           Index        RIGHT ATRIUM           Index LA diam:      5.00 cm 2.72 cm/m   RA Area:     21.60 cm LA Vol (A2C): 53.7 ml 29.23 ml/m  RA Volume:   73.60 ml  40.06 ml/m LA Vol (A4C): 95.0 ml 51.70 ml/m  AORTIC VALVE                     PULMONIC VALVE AV Area (Vmax):    1.14 cm      PV Vmax:          0.65 m/s AV Area (Vmean):   1.05 cm      PV Peak grad:     1.7 mmHg AV Area (VTI):     0.98 cm      PR End Diast Vel: 7.18 msec AV Vmax:           190.50 cm/s AV Vmean:          142.000 cm/s AV VTI:            0.377 m AV Peak Grad:      14.5 mmHg AV Mean Grad:      9.0 mmHg LVOT Vmax:         108.00 cm/s LVOT Vmean:        73.900 cm/s LVOT VTI:          0.183 m LVOT/AV VTI ratio: 0.49 AI PHT:            764 msec  AORTA Ao Root diam: 3.10 cm Ao Asc diam:  4.60 cm MITRAL VALVE               TRICUSPID VALVE MV Area (PHT): 3.87 cm    TV Peak grad:   30.2 mmHg MV Decel Time: 196 msec    TV Vmax:        2.75 m/s MV E velocity: 73.30 cm/s                             SHUNTS                            Systemic VTI:  0.18 m                            Systemic Diam: 1.60 cm Arnoldo Hooker MD Electronically signed by Arnoldo Hooker MD Signature Date/Time: 12/21/2021/4:05:04 PM    Final    US Venous Img Lower Bilateral (DVT)  Result  Date: 12/20/2021 CLINICAL DATA:  Leg swelling EXAM: BILATERAL LOWER EXTREMITY VENOUS DOPPLER ULTRASOUND TECHNIQUE: Gray-scale sonography with compression, as well as color and duplex ultrasound, were performed to evaluate the deep venous system(s) from the level of the common femoral vein through the popliteal and proximal calf veins. COMPARISON:  None available FINDINGS: VENOUS Normal compressibility of the common femoral, superficial femoral, and popliteal veins, as well as the visualized calf veins. Visualized portions of profunda femoral vein and great saphenous vein unremarkable. No filling defects to suggest DVT on grayscale or color Doppler imaging. Doppler waveforms show normal direction of venous flow, normal respiratory plasticity and response to augmentation. OTHER None. Limitations: Limited visualization of the calf veins. IMPRESSION: No lower extremity DVT.  Limited visualization of calf veins. Electronically Signed   By: Acquanetta Belling M.D.   On: 12/20/2021 17:21   DG Chest Port 1 View  Result Date: 12/19/2021 CLINICAL DATA:  Atrial fibrillation with rapid ventricular response. Fatigue. EXAM: PORTABLE CHEST 1 VIEW COMPARISON:  Radiographs earlier the same date and 10/11/2021. FINDINGS: 1508 hours. Stable cardiomegaly and aortic atherosclerosis. There is increased vascular congestion with possible mild pulmonary edema. No confluent airspace opacity, pneumothorax or significant pleural effusion. The bones are demineralized without acute osseous findings. Telemetry leads overlie the chest. IMPRESSION: Cardiomegaly with increased vascular congestion and possible mild pulmonary edema. Electronically Signed   By: Carey Bullocks M.D.    On: 12/19/2021 15:17   DG Chest 2 View  Result Date: 12/19/2021 CLINICAL DATA:  New onset atrial fibrillation EXAM: CHEST - 2 VIEW COMPARISON:  10/11/2021 FINDINGS: The heart is mildly enlarged.  No pulmonary vascular congestion. The descending thoracic aorta is dilated measuring up to 4.9 cm. Patchy opacities at the left lung base likely due to atelectasis. Lungs otherwise clear. IMPRESSION: 1. Mild cardiomegaly. 2. Dilated descending thoracic aorta measuring up to 4.9 cm can be better evaluated with CT angiography of the chest, if clinically appropriate. Electronically Signed   By: Acquanetta Belling M.D.   On: 12/19/2021 11:20     Assessment and Recommendation  88 y.o. female with known paroxysmal nonvalvular atrial fibrillation with rapid ventricular rate now with sick sinus syndrome type issues now on medication management schedule which appears to balance her issues with an average heart rate of approximately 100 to 110 bpm.  Would not likely change current medical regimen unless further heart block or other issues were occur and at that time would consider the possibility of a Micra pacemaker placement. 1.  Continuation of metoprolol at current dosage with abstinence of diltiazem at this time due to concerns of sick sinus syndrome and pauses 2.  Continue anticoagulation for further risk reduction stroke with atrial fibrillation 3.  No further cardiac diagnostics necessary at this time 4.  No further intervention with diastolic dysfunction for which the patient has received diuretics including torsemide which appears to be stable at this time 5.  If ambulating well with no further significant symptoms okay for discharged home on current medical regimen 6.  Call if further questions  Signed, Arnoldo Hooker M.D. FACC

## 2021-12-22 NOTE — Discharge Summary (Signed)
Physician Discharge Summary   Patient: Ann [name] MRN: [id number] DOB: [birth year]  Admit date:     12/19/2021  Discharge date: 12/22/21  Discharge Physician: Marrion Coy   PCP: Jaclyn Shaggy, MD   Recommendations at discharge:   Follow-up with PCP in 1 week.  Consider refer to palliative care. Follow-up with cardiology as previous scheduled.  Discharge Diagnoses: Principal Problem:   Atrial fibrillation with RVR (HCC) Active Problems:   Benign essential hypertension   Hypothyroid   Hyperlipidemia   Myocardial injury   Leg swelling   Acute on chronic diastolic CHF (congestive heart failure) (HCC)   Chronic atrial fibrillation with RVR (HCC)   Dementia without behavioral disturbance (HCC)   Moderate mitral regurgitation   Atrial fibrillation with rapid ventricular response (HCC)  Resolved Problems:   * No resolved hospital problems. Ann [name]: Ms. Ann [name] is a 88 year old female with history of atrial fibrillation currently on Eliquis, hypertension, heart failure preserved ejection fraction, memory loss, hypothyroid, who presents emergency department from [facility] cardiology clinic for chief concerns of atrial fibrillation with RVR. Patient also has significant leg edema, chest x-ray showed mild pulmonary edema, elevated BNP.  Patient also had shortness of breath and orthopnea.  Consistent with acute on chronic diastolic congestive heart failure. Patient was continued on metoprolol, added diltiazem.  Also started on IV Lasix. 7/15.  Patient developed AV block with a pause of 4 seconds, diltiazem discontinued.  Assessment and Plan:  Chronic atrial fibrillation with RVR. AV block with pauses up to 4 seconds. Acute on chronic diastolic congestive heart failure. Elevated troponin secondary to A-fib and congestive heart failure. Moderate mitral valve regurgitation. Patient volume status much improved, resume home dose with diuretics. Telemetry showed AV  block with pauses up to 4 seconds happened multiple times after the diltiazem started.    It did not recur after diltiazem was discontinued.  After discussed with cardiology, there is no consideration for pacemaker.  Therefore, we have to allow patient heart rate to run higher.  However, I increase the metoprolol to 75 mg in the morning and 50 mg in the evening.  Did not have any pauses at this time.   Cardiology has cleared patient for discharge, he is medically stable to go home.   Essential hypertension. Metoprolol dose increased  Alzheimer's dementia. Condition stable, patient is confused, no agitation.        Consultants: Cardiology. Procedures performed: None  Disposition: Home Diet recommendation:  Discharge Diet Orders (From admission, onward)     Start     Ordered   12/22/21 0000  Diet - low sodium heart healthy        12/22/21 1305           Cardiac diet DISCHARGE MEDICATION: Allergies as of 12/22/2021       Reactions   Penicillins         Medication List     STOP taking these medications    amLODipine 2.5 MG tablet Commonly known as: NORVASC   cyanocobalamin 1000 MCG tablet   losartan 50 MG tablet Commonly known as: COZAAR       TAKE these medications    acetaminophen 325 MG tablet Commonly known as: TYLENOL Take 650 mg by mouth every 6 (six) hours as needed for headache. For pain   albuterol 108 (90 Base) MCG/ACT inhaler Commonly known as: VENTOLIN HFA Inhale 2 puffs into the lungs every 6 (six) hours as needed for wheezing or shortness of  breath. Dispense 1 spacer to use with the inhaler   apixaban 5 MG Tabs tablet Commonly known as: ELIQUIS Take 1 tablet (5 mg total) by mouth 2 (two) times daily.   Breo Ellipta 100-25 MCG/ACT Aepb Generic drug: fluticasone furoate-vilanterol 1 puff daily.   furosemide 20 MG tablet Commonly known as: LASIX Take 1 tablet (20 mg total) by mouth daily.   isosorbide mononitrate 30 MG 24 hr  tablet Commonly known as: IMDUR Take 30 mg by mouth daily.   levothyroxine 50 MCG tablet Commonly known as: SYNTHROID Take 50 mcg by mouth daily.   metoprolol tartrate 50 MG tablet Commonly known as: LOPRESSOR Take 75 mg (1 and half tabs) in the morning and 50mg  (1 tab) in the evening. What changed:  how much to take how to take this when to take this additional instructions        Follow-up Information     , MD Follow up in 1 week(s).   Specialty: Internal Medicine Contact information: [address]   [place] [phone]         [phone], MD Follow up in 1 week(s).   Specialty: Cardiology Contact information: [address] [facility] [place] [phone]                Discharge Exam: [phone] Weights   12/19/21 1037 12/20/21 12/22/21 12/22/21 0352  Weight: 73 kg 72.4 kg 72.8 kg   General exam: Appears calm and comfortable  Respiratory system: Clear to auscultation. Respiratory effort normal. Cardiovascular system: Irregularly irregular, tachycardic. No JVD, murmurs, rubs, gallops or clicks. No pedal edema. Gastrointestinal system: Abdomen is nondistended, soft and nontender. No organomegaly or masses felt. Normal bowel sounds heard. Central nervous system: Alert and oriented x1. No focal neurological deficits. Extremities: Symmetric 5 x 5 power. Skin: No rashes, lesions or ulcers    Condition at discharge: fair  The results of significant diagnostics from this hospitalization (including imaging, microbiology, ancillary and laboratory) are listed below for reference.   Imaging Studies: ECHOCARDIOGRAM COMPLETE  Result Date: 12/21/2021    ECHOCARDIOGRAM REPORT   Patient Name:   Ann [name] Date of Exam: 12/20/2021 Medical Rec #:  12/22/2021      Height:       67.0 in Accession #:    [id number]     Weight:       159.6 lb Date of Birth:  1919-08-17      BSA:           1.837 m Patient Age:    89 years       BP:           133/109 mmHg Patient Gender: F              HR:           79 bpm. Exam Location:  [facility] Procedure: 2D Echo Indications:     Atrial Fibrillation I48.91  History:         Patient has prior history of Echocardiogram examinations, most                  recent 10/13/2021.  Sonographer:     12/13/2021 RDCS Referring Phys:  Overton Mam AMY N COX Diagnosing Phys: [id number] MD IMPRESSIONS  1. Left ventricular ejection fraction, by estimation, is 60 to 65%. The left ventricle has normal function. The left ventricle has no regional wall motion abnormalities. Left ventricular diastolic  parameters were normal.  2. Right ventricular systolic function is normal. The right ventricular size is normal.  3. Left atrial size was mild to moderately dilated.  4. Right atrial size was mild to moderately dilated.  5. The mitral valve is normal in structure. Moderate mitral valve regurgitation.  6. Tricuspid valve regurgitation is moderate to severe.  7. The aortic valve is normal in structure. Aortic valve regurgitation is mild to moderate.  8. Aortic dilatation noted. 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 FINDINGS  Left Ventricle: Left ventricular ejection fraction, by estimation, is 60 to 65%. The left ventricle has normal function. The left ventricle has no regional wall motion abnormalities. The left ventricular internal cavity size was normal in size. There is  no left ventricular hypertrophy. Left ventricular diastolic parameters were normal. Right Ventricle: The right ventricular size is normal. No increase in right ventricular wall thickness. Right ventricular systolic function is normal. Left Atrium: Left atrial size was mild to moderately dilated. Right Atrium: Right atrial size was mild to moderately dilated. Pericardium: There is no evidence of pericardial effusion. Mitral Valve: The mitral valve is normal in structure. Moderate mitral  valve regurgitation. Tricuspid Valve: The tricuspid valve is normal in structure. Tricuspid valve regurgitation is moderate to severe. Aortic Valve: The aortic valve is normal in structure. Aortic valve regurgitation is mild to moderate. Aortic regurgitation PHT measures 764 msec. Aortic valve mean gradient measures 9.0 mmHg. Aortic valve peak gradient measures 14.5 mmHg. Aortic valve area, by VTI measures 0.98 cm. Pulmonic Valve: The pulmonic valve was normal in structure. Pulmonic valve regurgitation is mild. Aorta: Aortic dilatation noted. 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 IAS/Shunts: No atrial level shunt detected by color flow Doppler.  LEFT VENTRICLE PLAX 2D LVIDd:         4.71 cm     Diastology LVIDs:         3.66 cm     LV e' medial:    4.79 cm/s LV PW:         1.27 cm     LV E/e' medial:  15.3 LV IVS:        1.02 cm     LV e' lateral:   7.72 cm/s LVOT diam:     1.60 cm     LV E/e' lateral: 9.5 LV SV:         37 LV SV Index:   20 LVOT Area:     2.01 cm  LV Volumes (MOD) LV vol d, MOD A2C: 46.5 ml LV vol d, MOD A4C: 87.9 ml LV vol s, MOD A2C: 30.3 ml LV vol s, MOD A4C: 35.4 ml LV SV MOD A2C:     16.2 ml LV SV MOD A4C:     87.9 ml LV SV MOD BP:      29.0 ml RIGHT VENTRICLE TAPSE (M-mode): 2.1 cm LEFT ATRIUM           Index        RIGHT ATRIUM           Index LA diam:      5.00 cm 2.72 cm/m   RA Area:     21.60 cm LA Vol (A2C): 53.7 ml 29.23 ml/m  RA Volume:   73.60 ml  40.06 ml/m LA Vol (A4C): 95.0 ml 51.70 ml/m  AORTIC VALVE  PULMONIC VALVE AV Area (Vmax):    1.14 cm      PV Vmax:          0.65 m/s AV Area (Vmean):   1.05 cm      PV Peak grad:     1.7 mmHg AV Area (VTI):     0.98 cm      PR End Diast Vel: 7.18 msec AV Vmax:           190.50 cm/s AV Vmean:          142.000 cm/s AV VTI:            0.377 m AV Peak Grad:      14.5 mmHg AV Mean Grad:      9.0 mmHg LVOT Vmax:         108.00 cm/s LVOT Vmean:        73.900 cm/s LVOT VTI:          0.183  m LVOT/AV VTI ratio: 0.49 AI PHT:            764 msec  AORTA Ao Root diam: 3.10 cm Ao Asc diam:  4.60 cm MITRAL VALVE               TRICUSPID VALVE MV Area (PHT): 3.87 cm    TV Peak grad:   30.2 mmHg MV Decel Time: 196 msec    TV Vmax:        2.75 m/s MV E velocity: 73.30 cm/s                            SHUNTS                            Systemic VTI:  0.18 m                            Systemic Diam: 1.60 cm Arnoldo Hooker MD Electronically signed by Arnoldo Hooker MD Signature Date/Time: 12/21/2021/4:05:04 PM    Final    US Venous Img Lower Bilateral (DVT)  Result Date: 12/20/2021 CLINICAL DATA:  Leg swelling EXAM: BILATERAL LOWER EXTREMITY VENOUS DOPPLER ULTRASOUND TECHNIQUE: Gray-scale sonography with compression, as well as color and duplex ultrasound, were performed to evaluate the deep venous system(s) from the level of the common femoral vein through the popliteal and proximal calf veins. COMPARISON:  None available FINDINGS: VENOUS Normal compressibility of the common femoral, superficial femoral, and popliteal veins, as well as the visualized calf veins. Visualized portions of profunda femoral vein and great saphenous vein unremarkable. No filling defects to suggest DVT on grayscale or color Doppler imaging. Doppler waveforms show normal direction of venous flow, normal respiratory plasticity and response to augmentation. OTHER None. Limitations: Limited visualization of the calf veins. IMPRESSION: No lower extremity DVT.  Limited visualization of calf veins. Electronically Signed   By: Acquanetta Belling M.D.   On: 12/20/2021 17:21   DG Chest Port 1 View  Result Date: 12/19/2021 CLINICAL DATA:  Atrial fibrillation with rapid ventricular response. Fatigue. EXAM: PORTABLE CHEST 1 VIEW COMPARISON:  Radiographs earlier the same date and 10/11/2021. FINDINGS: 1508 hours. Stable cardiomegaly and aortic atherosclerosis. There is increased vascular congestion with possible mild pulmonary edema. No confluent  airspace opacity, pneumothorax or significant pleural effusion. The bones are demineralized without acute osseous findings. Telemetry leads overlie the chest. IMPRESSION: Cardiomegaly with  increased vascular congestion and possible mild pulmonary edema. Electronically Signed   By: Carey Bullocks M.D.   On: 12/19/2021 15:17   DG Chest 2 View  Result Date: 12/19/2021 CLINICAL DATA:  New onset atrial fibrillation EXAM: CHEST - 2 VIEW COMPARISON:  10/11/2021 FINDINGS: The heart is mildly enlarged.  No pulmonary vascular congestion. The descending thoracic aorta is dilated measuring up to 4.9 cm. Patchy opacities at the left lung base likely due to atelectasis. Lungs otherwise clear. IMPRESSION: 1. Mild cardiomegaly. 2. Dilated descending thoracic aorta measuring up to 4.9 cm can be better evaluated with CT angiography of the chest, if clinically appropriate. Electronically Signed   By: Acquanetta Belling M.D.   On: 12/19/2021 11:20    Microbiology: Results for orders placed or performed during the hospital encounter of 10/11/21  Resp Panel by RT-PCR (Flu A&B, Covid) Nasopharyngeal Swab     Status: Abnormal   Collection Time: 10/11/21  1:01 PM   Specimen: Nasopharyngeal Swab; Nasopharyngeal(NP) swabs in vial transport medium  Result Value Ref Range Status   SARS Coronavirus 2 by RT PCR POSITIVE (A) NEGATIVE Final    Comment: (NOTE) SARS-CoV-2 target nucleic acids are DETECTED.  The SARS-CoV-2 RNA is generally detectable in upper respiratory specimens during the acute phase of infection. Positive results are indicative of the presence of the identified virus, but do not rule out bacterial infection or co-infection with other pathogens not detected by the test. Clinical correlation with patient history and other diagnostic information is necessary to determine patient infection status. The expected result is Negative.  Fact Sheet for Patients: BloggerCourse.com  Fact Sheet for  Healthcare Providers: SeriousBroker.it  This test is not yet approved or cleared by the [place] FDA and  has been authorized for detection and/or diagnosis of SARS-CoV-2 by FDA under an Emergency Use Authorization (EUA).  This EUA will remain in effect (meaning this test can be used) for the duration of  the COVID-19 declaration under Section 564(b)(1) of the A ct, 21 U.S.C. section 360bbb-3(b)(1), unless the authorization is terminated or revoked sooner.     Influenza A by PCR NEGATIVE NEGATIVE Final   Influenza B by PCR NEGATIVE NEGATIVE Final    Comment: (NOTE) The Xpert Xpress SARS-CoV-2/FLU/RSV plus assay is intended as an aid in the diagnosis of influenza from Nasopharyngeal swab specimens and should not be used as a sole basis for treatment. Nasal washings and aspirates are unacceptable for Xpert Xpress SARS-CoV-2/FLU/RSV testing.  Fact Sheet for Patients: BloggerCourse.com  Fact Sheet for Healthcare Providers: SeriousBroker.it  This test is not yet approved or cleared by the [place] FDA and has been authorized for detection and/or diagnosis of SARS-CoV-2 by FDA under an Emergency Use Authorization (EUA). This EUA will remain in effect (meaning this test can be used) for the duration of the COVID-19 declaration under Section 564(b)(1) of the Act, 21 U.S.C. section 360bbb-3(b)(1), unless the authorization is terminated or revoked.  Performed at [facility], [address] [place], [place] [address]     Labs: CBC: Recent Labs  Lab 12/19/21 1049 12/20/21 0446  WBC 4.2 4.3  HGB 11.9* 11.8*  HCT 38.2 36.9  MCV 98.5 97.4  PLT 288 269   Basic Metabolic Panel: Recent Labs  Lab 12/19/21 1049 12/19/21 1628 12/20/21 0446  NA 141  --  140  K 4.4  --  3.7  CL 107  --  108  CO2 24  --  24  GLUCOSE 95  --  99  BUN 27*  --  33*  CREATININE 0.88  --  0.91  CALCIUM  9.0  --  8.7*  MG 2.3 2.1  --   PHOS  --  4.4  --    Liver Function Tests: No results for input(s): "AST", "ALT", "ALKPHOS", "BILITOT", "PROT", "ALBUMIN" in the last 168 hours. CBG: No results for input(s): "GLUCAP" in the last 168 hours.  Discharge time spent: greater than 30 minutes.  Signed: Marrion Coyekui Drayke Grabel, MD [facility] 12/22/2021

## 2021-12-22 NOTE — TOC Transition Note (Signed)
Transition of Care [facility]) - CM/SW Discharge Note   Patient Details  Name: Ann [name] MRN: [id number] Date of Birth: Oct 02, 1919  Transition of Care [facility]) CM/SW Contact:  Margarito Liner, LCSW Phone Number: 12/22/2021, 1:16 PM   Clinical Narrative:  Patient has orders to discharge home today. No further concerns. CSW signing off.   Final next level of care: Home/Self Care Barriers to Discharge: Barriers Resolved   Patient Goals and CMS Choice        Discharge Placement                    Patient and family notified of of transfer: 12/22/21  Discharge Plan and Services                                     Social Determinants of Health (SDOH) Interventions     Readmission Risk Interventions     No data to display

## 2022-01-16 ENCOUNTER — Encounter: Payer: Self-pay | Admitting: Primary Care

## 2022-01-16 ENCOUNTER — Other Ambulatory Visit: Payer: Medicare HMO | Admitting: Primary Care

## 2022-01-16 DIAGNOSIS — I482 Chronic atrial fibrillation, unspecified: Secondary | ICD-10-CM

## 2022-01-16 DIAGNOSIS — Z515 Encounter for palliative care: Secondary | ICD-10-CM

## 2022-01-16 DIAGNOSIS — F0390 Unspecified dementia without behavioral disturbance: Secondary | ICD-10-CM

## 2022-01-16 NOTE — Progress Notes (Signed)
[facility] Palliative Care Consult Note Telephone: [phone]  Fax: [phone]   Date of encounter: 01/16/22 2:40 PM PATIENT NAME: Ann [name] [address] [id number]   [phone] (home)  DOB: [birth year] MRN: [id number] PRIMARY CARE PROVIDER:    Albina Billet, MD,  [address]   [place] [address] [phone]  REFERRING PROVIDER:   Albina Billet, MD [address]   [place],  [place] [address] [phone]  RESPONSIBLE PARTY:    Contact Information     Name Relation Home Work Mobile   Clayton A Daughter [phone]     Rhys Martini   [phone]   Grayland Jack   [phone]        I met face to face with patient and family in  home. Palliative Care was asked to follow this patient by consultation request of  Albina Billet, MD to address advance care planning and complex medical decision making. This is the initial visit.                                     ASSESSMENT AND PLAN / RECOMMENDATIONS:   Advance Care Planning/Goals of Care: Goals include to maximize quality of life and symptom management. Patient/health care surrogate gave his/her permission to discuss.Our advance care planning conversation included a discussion about:    The value and importance of advance care planning  Experiences with loved ones who have been seriously ill or have died  Exploration of personal, cultural or spiritual beliefs that might influence medical decisions  Exploration of goals of care in the event of a sudden injury or illness  Identification of a healthcare agent - Daughter is acting but son is official POA. Review  of an  advance directive document . Decision not to resuscitate  due to poor prognosis. I discussed the MOST  form they do not have one but did have a DNR form in the home. I went over the MOST form choices and explained the levels of care for interventions.  Daughter is living with the patient and is in  fact, her decision maker, but her brother is the actual named power of attorney who lives in [place]. I encouraged them to revise this so that she would have documentation if the patient wanted her daughter to be her healthcare proxy since she has moved in with her. I do believe patient has capacity if she would like to make this change.   CODE STATUS: DNR I spent 20 minutes providing this consultation. More than 50% of the time in this consultation was spent in counseling and care coordination. -------------------------------------------------------------------------------------------------------  Symptom Management/Plan: I met with patient in her home with her daughter. She was alert and oriented times two. She's recently been in the hospital where she was started on some new medication for atrial fibrillation. Daughter reports a flatter affect since that time and not returned to baseline function.  CHF/arrhythmias: We discussed disease process and debility at her age of almost 35. We discussed side effects of beta blockers which are new. She has been on INdur  for some time previous. Recently, she did have a dose change of 75 mg  In the a.m. to 50 mg  of the metoprolol in the a.m. and 50 mg  in the p.m. Apical pulse was 80 and irregular and radial pulse was 60 and  regular. Daughter is checking blood pressures and pulses over the day. She is generally stable with her weight around 158+ or minus a few pounds and weighs daily.   Fatigue: Daughter voices some discouragement at her loss of energy. We did discussed disease process as well as medication side effects  Arthritis: Has pain from chronic OA. Ambulates, no falls. Uses cane  Asthma, she reports asthma, and is on breo daily. Daughter states that her ability to use the inhaler is decreasing due to mentation and fatigue. I reached out to a Primary, who was in agreement with changing to [facility] to 2   puffs bi.d. with a spacer. I have a escribe this to [place] on [employer] in [place]. PCP office will handle any prior approval if needed prior authorization if needed.  Nutrition: Endorses she eats well, weights reported stable but has significant edema.  Follow up Palliative Care Visit: Palliative care will continue to follow for complex medical decision making, advance care planning, and clarification of goals. Return 6 weeks or prn.  This visit was coded based on medical decision making (MDM).  PPS: 40%  HOSPICE ELIGIBILITY/DIAGNOSIS: TBD  Chief Complaint: debility, fatigue  HISTORY OF PRESENT ILLNESS:  Ann [name] is a 88 y.o. year old female  with CAD, CHF, fatigue, debility . Patient seen today to review palliative care needs to include medical decision making and advance care planning as appropriate.   History obtained from review of EMR, discussion with primary team, and interview with family, facility staff/caregiver and/or Ms. Jaffer.  I reviewed available labs, medications, imaging, studies and related documents from the EMR.  Records reviewed and summarized above.   ROS  General: NAD ENMT: denies dysphagia Cardiovascular: denies chest pain, denies DOE Pulmonary: denies cough, denies increased SOB Abdomen: endorses good appetite, denies constipation, endorses continence of bowel GU: denies dysuria, endorses continence of urine MSK:  denies increased weakness,  no falls reported Skin: denies rashes or wounds Neurological: denies pain, denies insomnia Psych: Endorses positive mood Heme/lymph/immuno: denies bruises, abnormal bleeding  Physical Exam: Current and past weights:160 - 157  lbs range. Weighs daily. Constitutional: NAD General: frail appearing, WNWD  EYES: anicteric sclera, lids intact, no discharge  ENMT: intact hearing, oral mucous membranes moist, dentition intact CV: S1S2, RRR,  2+ bil LE  edema Pulmonary: LCTA, no increased work of breathing, no  cough, room air Abdomen: intake 70%, soft and non tender, no ascites GU: deferred MSK: + sarcopenia, moves all extremities, ambulatory with help Skin: warm and dry, no rashes or wounds on visible skin Neuro:  +eneralized weakness,  + cognitive impairment Psych: non-anxious affect, A and O x 2 Hem/lymph/immuno: no widespread bruising CURRENT PROBLEM LIST:  Patient Active Problem List   Diagnosis Date Noted   Acute on chronic diastolic CHF (congestive heart failure) (HCC) 12/20/2021   Chronic atrial fibrillation with RVR ([place]) 12/20/2021   Dementia without behavioral disturbance ([place]) 12/20/2021   Moderate mitral regurgitation 12/20/2021   Atrial fibrillation with rapid ventricular response ([place]) 12/20/2021   Atrial fibrillation with RVR ([place]) 12/19/2021   Myocardial injury 12/19/2021   Leg swelling 12/19/2021   Acute CHF (congestive heart failure) ([place]) 10/11/2021   Arrhythmia 10/11/2021   COVID-19 virus infection 10/11/2021   Elevated troponin 10/11/2021   Hyperbilirubinemia 10/11/2021   Aortic valve disease 01/30/2019   Asymmetric septal hypertrophy ([place]) 08/30/2017   Benign essential hypertension 08/07/2015   Hypothyroid 08/24/2013   Hyperlipidemia 08/24/2013   PAST MEDICAL HISTORY:  Active Ambulatory Problems  Diagnosis Date Noted   Acute CHF (congestive heart failure) ([place]) 10/11/2021   Aortic valve disease 01/30/2019   Asymmetric septal hypertrophy ([place]) 08/30/2017   Benign essential hypertension 08/07/2015   Hypothyroid 08/24/2013   Hyperlipidemia 08/24/2013   Arrhythmia 10/11/2021   COVID-19 virus infection 10/11/2021   Elevated troponin 10/11/2021   Hyperbilirubinemia 10/11/2021   Atrial fibrillation with RVR ([place]) 12/19/2021   Myocardial injury 12/19/2021   Leg swelling 12/19/2021   Acute on chronic diastolic CHF (congestive heart failure) ([place]) 12/20/2021   Chronic atrial fibrillation with RVR ([place]) 12/20/2021   Dementia without behavioral disturbance ([place])  12/20/2021   Moderate mitral regurgitation 12/20/2021   Atrial fibrillation with rapid ventricular response ([place]) 12/20/2021   Resolved Ambulatory Problems    Diagnosis Date Noted   No Resolved Ambulatory Problems   Past Medical History:  Diagnosis Date   Aortic stenosis    CHF (congestive heart failure) (HCC)    COPD (chronic obstructive pulmonary disease) (HCC)    Dementia (HCC)    High cholesterol    Hypertension    Thyroid disease    SOCIAL HX:  Social History   Tobacco Use   Smoking status: Never   Smokeless tobacco: Not on file  Substance Use Topics   Alcohol use: Not on file   FAMILY HX: Family History  Problem Relation Age of Onset   Arthritis Daughter     ALLERGIES:  Allergies  Allergen Reactions   Penicillins      PERTINENT MEDICATIONS:  Outpatient Encounter Medications as of 01/16/2022  Medication Sig   acetaminophen (TYLENOL) 325 MG tablet Take 650 mg by mouth every 6 (six) hours as needed for headache. For pain   albuterol (PROVENTIL HFA;VENTOLIN HFA) 108 (90 Base) MCG/ACT inhaler Inhale 2 puffs into the lungs every 6 (six) hours as needed for wheezing or shortness of breath. Dispense 1 spacer to use with the inhaler   apixaban (ELIQUIS) 5 MG TABS tablet Take 1 tablet (5 mg total) by mouth 2 (two) times daily.   furosemide (LASIX) 20 MG tablet Take 1 tablet (20 mg total) by mouth daily.   isosorbide mononitrate (IMDUR) 30 MG 24 hr tablet Take 30 mg by mouth daily.   levothyroxine (SYNTHROID, LEVOTHROID) 50 MCG tablet Take 50 mcg by mouth daily.   metoprolol tartrate (LOPRESSOR) 50 MG tablet Take 75 mg (1 and half tabs) in the morning and 110m (1 tab) in the evening. (Patient taking differently: Take 50 mg by mouth 2 (two) times daily. Take 75 mg (1 and half tabs) in the morning and 533m(1 tab) in the evening.)   mometasone-formoterol (DULERA) 100-5 MCG/ACT AERO Inhale 2 puffs into the lungs 2 (two) times daily.   BREO ELLIPTA 100-25 MCG/ACT AEPB 1 puff  daily. (Patient not taking: Reported on 01/16/2022)   No facility-administered encounter medications on file as of 01/16/2022.    Thank you for the opportunity to participate in the care of Ms. Tompkins.  The palliative care team will continue to follow. Please call our office at [phone] we can be of additional assistance.   KaJason CoopNP , DNP, AGPCNP-BC  COVID-19 PATIENT SCREENING TOOL Asked and negative response unless otherwise noted:  Have you had symptoms of covid, tested positive or been in contact with someone with symptoms/positive test in the past 5-10 days?

## 2022-01-19 ENCOUNTER — Other Ambulatory Visit: Payer: Medicare HMO | Admitting: Primary Care

## 2022-01-21 ENCOUNTER — Ambulatory Visit: Payer: Medicare HMO | Attending: Family | Admitting: Family

## 2022-01-21 ENCOUNTER — Encounter: Payer: Self-pay | Admitting: Family

## 2022-01-21 VITALS — BP 110/82 | HR 96 | Resp 18 | Ht 66.0 in | Wt 165.0 lb

## 2022-01-21 DIAGNOSIS — I4891 Unspecified atrial fibrillation: Secondary | ICD-10-CM | POA: Insufficient documentation

## 2022-01-21 DIAGNOSIS — E785 Hyperlipidemia, unspecified: Secondary | ICD-10-CM | POA: Diagnosis not present

## 2022-01-21 DIAGNOSIS — J449 Chronic obstructive pulmonary disease, unspecified: Secondary | ICD-10-CM | POA: Diagnosis not present

## 2022-01-21 DIAGNOSIS — I4819 Other persistent atrial fibrillation: Secondary | ICD-10-CM

## 2022-01-21 DIAGNOSIS — Z8616 Personal history of COVID-19: Secondary | ICD-10-CM | POA: Insufficient documentation

## 2022-01-21 DIAGNOSIS — I5032 Chronic diastolic (congestive) heart failure: Secondary | ICD-10-CM | POA: Diagnosis present

## 2022-01-21 DIAGNOSIS — I110 Hypertensive heart disease with heart failure: Secondary | ICD-10-CM | POA: Insufficient documentation

## 2022-01-21 DIAGNOSIS — F0390 Unspecified dementia without behavioral disturbance: Secondary | ICD-10-CM | POA: Insufficient documentation

## 2022-01-21 DIAGNOSIS — F03A0 Unspecified dementia, mild, without behavioral disturbance, psychotic disturbance, mood disturbance, and anxiety: Secondary | ICD-10-CM | POA: Diagnosis not present

## 2022-01-21 DIAGNOSIS — I10 Essential (primary) hypertension: Secondary | ICD-10-CM | POA: Diagnosis not present

## 2022-01-21 DIAGNOSIS — I082 Rheumatic disorders of both aortic and tricuspid valves: Secondary | ICD-10-CM | POA: Diagnosis not present

## 2022-01-21 DIAGNOSIS — E079 Disorder of thyroid, unspecified: Secondary | ICD-10-CM | POA: Diagnosis not present

## 2022-01-21 NOTE — Progress Notes (Signed)
Patient ID: Ann Ann [name], female    DOB: 1920-03-02, 88 y.o.   MRN: [id number]  HPI  Ann Ann [name] is Ann [name] with Ann [name] history of hyperlipidemia, HTN, thyroid disease, aortic stenosis, COPD, dementia and chronic heart failure.   Echo report from 12/20/21 reviewed and showed an EF of 60-65% along with mild/moderate LAE/RAE, moderate MR, mild/moderate AR and moderate/severe TR. Echo report from 10/13/21 reviewed and showed an EF of 65-70% along with mild LVH/ LAE, moderate MR and mild/moderate AR.   Admitted 12/19/21 due to AF RVR and HF exacerbation. Diltiazem and IV lasix started. Cardiology consult obtained. Patient developed AV block with Ann [name] pause of 4 seconds, diltiazem discontinued. Discharged after 3 days. Admitted 10/11/21 due to shortness of breath. Initially given IV lasix with transition to oral diuretics. Found to be covid +. Cardiology consult obtained. Received 5 days of reduced paxlovid. Elevated troponin thought to be due to demand ischemia. Discharged after 3 days.   She presents today for Ann [name] follow-up visit with Ann [name] chief complaint of moderate fatigue with very little exertion. She describes this as chronic although has worsened since being put on metoprolol. She has associated shortness of breath, pedal edema, urinary burning and intermittent confusion along with this. Denies difficulty sleeping, dizziness, cough, chest pain, palpitations, abdominal distention or weight gain.   Information was obtained from daughter and granddaughter as patient doesn't say much. Family says that patient seems withdrawn as prior to the new medications, she would be joking and talking more. Now she doesn't even want to go sit on her porch.   Has seen cardiology since admission and had metoprolol decreased but family doesn't notice any difference in her symptoms. Has had palliative care visit since discharge.   Past Medical History:  Diagnosis Date   Aortic stenosis    CHF (congestive heart failure) (HCC)     COPD (chronic obstructive pulmonary disease) (HCC)    Dementia (HCC)    High cholesterol    Hypertension    Thyroid disease    Past Surgical History:  Procedure Laterality Date   APPENDECTOMY     JOINT REPLACEMENT     THYROID SURGERY     Family History  Problem Relation Age of Onset   Arthritis Daughter    Social History   Tobacco Use   Smoking status: Never   Smokeless tobacco: Not on file  Substance Use Topics   Alcohol use: Not on file   Allergies  Allergen Reactions   Penicillins    Prior to Admission medications   Medication Sig Start Date End Date Taking? Authorizing Ann Ann [name]  albuterol (PROVENTIL HFA;VENTOLIN HFA) 108 (90 Base) MCG/ACT inhaler Inhale 2 puffs into the lungs every 6 (six) hours as needed for wheezing or shortness of breath. Dispense 1 spacer to use with the inhaler 07/15/15  Yes Ann Natal, Ann [name]  apixaban (ELIQUIS) 5 MG TABS tablet Take 1 tablet (5 mg total) by mouth 2 (two) times daily. 12/18/21  Yes Ann Geeslin [name], Ann [name]  BREO ELLIPTA 100-25 MCG/ACT AEPB 1 puff daily. 09/24/21  Yes Ann Ann [name], Historical, Ann [name]  furosemide (LASIX) 20 MG tablet Take 1 tablet (20 mg total) by mouth daily. 10/15/21  Yes Ann Killian, Ann [name]  isosorbide mononitrate (IMDUR) 30 MG 24 hr tablet Take 30 mg by mouth daily. 05/08/21  Yes Ann Ann [name], Historical, Ann [name]  levothyroxine (SYNTHROID, LEVOTHROID) 50 MCG tablet Take 50 mcg by mouth daily.   Yes Ann Ann [name], Historical, Ann [name]  metoprolol tartrate (LOPRESSOR) 50 MG  tablet Take 75 mg (1 and half tabs) in the morning and 50mg  (1 tab) in the evening. Patient taking differently: Take 50 mg by mouth 2 (two) times daily. Take 75 mg (1 and half tabs) in the morning and 50mg  (1 tab) in the evening. 12/22/21  Yes , Ann [name]  mometasone-formoterol [facility]) 100-5 MCG/ACT AERO Inhale 2 puffs into the lungs 2 (two) times daily. 01/16/22  Yes Ann Ann [name], Historical, Ann [name]  acetaminophen (TYLENOL) 325 MG tablet Take 650 mg by mouth every 6 (six) hours  as needed for headache. For pain    Ann Ann [name], Historical, Ann [name]    Review of Systems  Constitutional:  Positive for fatigue (easily). Negative for appetite change.  HENT:  Negative for congestion, postnasal drip and sore throat.   Eyes: Negative.   Respiratory:  Positive for shortness of breath (worsens at times). Negative for cough.   Cardiovascular:  Positive for leg swelling (worsening). Negative for chest pain and palpitations.  Gastrointestinal:  Negative for abdominal distention and abdominal pain.  Endocrine: Negative.   Genitourinary:  Positive for dysuria (c/o urinary burning Ann [name] few days ago).  Musculoskeletal:  Negative for back pain.  Skin: Negative.   Allergic/Immunologic: Negative.   Neurological:  Negative for dizziness and light-headedness.  Hematological:  Negative for adenopathy. Bruises/bleeds easily.  Psychiatric/Behavioral:  Positive for confusion. Negative for dysphoric mood and sleep disturbance (sleeping on 1 pillow). The patient is not nervous/anxious.    Vitals:   01/21/22 1532  BP: 110/82  Pulse: 96  Resp: 18  SpO2: 91%  Weight: 165 lb (74.8 kg)  Height: 5\' 6"  (1.676 m)   Wt Readings from Last 3 Encounters:  01/21/22 165 lb (74.8 kg)  12/22/21 160 lb 7.9 oz (72.8 kg)  12/18/21 162 lb 8 oz (73.7 kg)   Lab Results  Component Value Date   CREATININE 0.91 12/20/2021   CREATININE 0.88 12/19/2021   CREATININE 0.66 10/14/2021   Physical Exam Vitals and nursing note reviewed. Exam conducted with Ann [name] chaperone present (daughter & granddaughter).  Constitutional:      Appearance: Normal appearance.  HENT:     Head: Normocephalic and atraumatic.  Cardiovascular:     Rate and Rhythm: Normal rate. Rhythm irregular.  Pulmonary:     Effort: Pulmonary effort is normal. No respiratory distress.     Breath sounds: No wheezing or rales.  Abdominal:     General: There is no distension.     Palpations: Abdomen is soft.     Tenderness: There is no abdominal  tenderness.  Musculoskeletal:        General: No tenderness.     Cervical back: Normal range of motion and neck supple.     Right lower leg: No tenderness. Edema (1+ pitting) present.     Left lower leg: No tenderness. Edema (1+ pitting) present.  Skin:    General: Skin is warm and dry.  Neurological:     General: No focal deficit present.     Mental Status: She is alert and oriented to person, place, and time.  Psychiatric:        Mood and Affect: Mood normal.        Behavior: Behavior normal.        Thought Content: Thought content normal.    Assessment & Plan:  1: Chronic heart failure preserved ejection fraction with structural changes (LVH/LAE)- - NYHA class III - euvolemic - weighing daily; reminded to call for an overnight weight gain of > 2 Ann [name] or  Ann [name] weekly weight gain of >5 Ann [name] - weight up 3 Ann [name] since last visit here 1 month ago - not adding much salt to her food; encouraged her to not add any salt to her food; daughter is trying to be mindful of sodium content of foods - BNP 12/19/21 was 815.1  2: HTN- - BP looks good (110/82) - sees PCP Ann Ann [name])  - BMP 12/20/21 reviewed and showed sodium 140, potassium 3.7, creatinine 0.91 and GFR 57  3: COPD- - using albuterol PRN and breo daily  4: Dementia- - lives with daughter - + short-term memory loss  - confusion seems to be worse per family and patient had complained of urinary burning; emphasized that they call her PCP to get urine checked to make sure she doesn't have UTI - palliative care visit done 01/16/22  5: Atrial fibrillation- - saw cardiology Ann Ann [name]) 12/31/21 - will decrease her metoprolol to 25mg  AM and continue 50mg  QPM in case the metoprolol is contributing to AMS - could consider stopping metoprolol and trying CCB in place of it   Patient did not bring her medications nor Ann [name] list. Each medication was verbally reviewed with the patient and she was encouraged to bring the bottles to every visit to  confirm accuracy of list.   Return in 1 month, sooner if needed.

## 2022-01-21 NOTE — Patient Instructions (Addendum)
Continue weighing daily and call for an overnight weight gain of 3 pounds or more or a weekly weight gain of more than 5 pounds.  If you have voicemail, please make sure your mailbox is cleaned out so that we may leave a message and please make sure to listen to any voicemails.    Decrease morning metoprolol to 1/2 tablet and continue 1 tablet in the evening.    Call Dr. Maree Krabbe office to get urine specimen checked for possible infection

## 2022-01-29 ENCOUNTER — Telehealth: Payer: Self-pay

## 2022-01-29 ENCOUNTER — Other Ambulatory Visit: Payer: Self-pay | Admitting: Family

## 2022-01-29 MED ORDER — DILTIAZEM HCL ER COATED BEADS 120 MG PO CP24: 120.0000 mg | ORAL_CAPSULE | Freq: Every day | ORAL | 5 refills | Status: DC

## 2022-01-29 NOTE — Telephone Encounter (Signed)
Spoke with daughter and informed her that diltiazem 120 mg once daily has been called in to pharmacy [facility] [address]) and she can pick it up today and take first dose tomorrow morning. She needs to stop taking metoprolol completely when she begins taking diltiazem, per [facility].  Also informed daughter that they need to schedule a follow up with cardiologist for her atrial fibrillation as soon as possible.   Daughter verbalized understanding and was able to state back the above. She states she will call if any other questions or concerns arise.  Suanne Marker, RN

## 2022-01-30 ENCOUNTER — Telehealth: Payer: Self-pay

## 2022-01-30 NOTE — Telephone Encounter (Signed)
1143 am.  Message received from [facility] requesting a call back.  Spoke with Ms. Thelma Barge and she advised the [facility] that Clearnce Sorrel, NP prescribed has not been picked up.  The medication would cost $300. Daughter is uncertain what to do now as they are unable to afford this.  Message sent to Clearnce Sorrel, NP.

## 2022-02-13 ENCOUNTER — Telehealth: Payer: Self-pay

## 2022-02-13 NOTE — Telephone Encounter (Signed)
Patient's daughter, Scarlette Calico, called earlier looking for advice if there's anything else they can do to get the swelling down in her ankles/feet. She was previously on 20 mg furosemide daily, but cardiologist increased to 40 mg daily in August, and encouraged compression hose and low salt diet. But she hasn't been able to wear the compression hose and she still gives her some salt to help encourage her to eat. She hasn't had any weight gain. Current weight 157.4.  She states her mother has been urinating frequently at night since increasing lasix to 40 mg. Provider notified.   Response, Per Clarisa Kindred:  [facility] Referral can be placed if patient would be interested in trying compression boots. Ensure patient is taking furosemide in the AM.  Daughter states she would like referral placed for compression boots and she states they will try taking furosemide earlier in the AM instead of the current late morning time. Provider notified to place referral to [facility].   Suanne Marker, RN

## 2022-02-16 ENCOUNTER — Telehealth: Payer: Self-pay | Admitting: Family

## 2022-02-16 NOTE — Telephone Encounter (Signed)
Sent a referral with patients recent two notes for patient to [facility].   Clell Trahan, NT

## 2022-02-18 ENCOUNTER — Ambulatory Visit: Payer: Medicare HMO | Attending: Family | Admitting: Family

## 2022-02-18 ENCOUNTER — Encounter: Payer: Self-pay | Admitting: Family

## 2022-02-18 VITALS — BP 120/87 | HR 73 | Resp 16 | Ht 67.0 in | Wt 153.5 lb

## 2022-02-18 DIAGNOSIS — I082 Rheumatic disorders of both aortic and tricuspid valves: Secondary | ICD-10-CM | POA: Diagnosis not present

## 2022-02-18 DIAGNOSIS — I110 Hypertensive heart disease with heart failure: Secondary | ICD-10-CM | POA: Diagnosis not present

## 2022-02-18 DIAGNOSIS — Z7901 Long term (current) use of anticoagulants: Secondary | ICD-10-CM | POA: Diagnosis not present

## 2022-02-18 DIAGNOSIS — I5032 Chronic diastolic (congestive) heart failure: Secondary | ICD-10-CM | POA: Insufficient documentation

## 2022-02-18 DIAGNOSIS — E079 Disorder of thyroid, unspecified: Secondary | ICD-10-CM | POA: Diagnosis not present

## 2022-02-18 DIAGNOSIS — Z8616 Personal history of COVID-19: Secondary | ICD-10-CM | POA: Insufficient documentation

## 2022-02-18 DIAGNOSIS — F0390 Unspecified dementia without behavioral disturbance: Secondary | ICD-10-CM | POA: Diagnosis not present

## 2022-02-18 DIAGNOSIS — F03A0 Unspecified dementia, mild, without behavioral disturbance, psychotic disturbance, mood disturbance, and anxiety: Secondary | ICD-10-CM | POA: Diagnosis not present

## 2022-02-18 DIAGNOSIS — J449 Chronic obstructive pulmonary disease, unspecified: Secondary | ICD-10-CM | POA: Diagnosis not present

## 2022-02-18 DIAGNOSIS — I10 Essential (primary) hypertension: Secondary | ICD-10-CM | POA: Diagnosis not present

## 2022-02-18 DIAGNOSIS — I4891 Unspecified atrial fibrillation: Secondary | ICD-10-CM | POA: Diagnosis not present

## 2022-02-18 DIAGNOSIS — I4819 Other persistent atrial fibrillation: Secondary | ICD-10-CM

## 2022-02-18 NOTE — Patient Instructions (Signed)
Continue weighing daily and call for an overnight weight gain of 3 pounds or more or a weekly weight gain of more than 5 pounds.   If you have voicemail, please make sure your mailbox is cleaned out so that we may leave a message and please make sure to listen to any voicemails.     

## 2022-02-18 NOTE — Progress Notes (Signed)
Patient ID: Ann [name], female    DOB: 11-26-1919, 88 y.o.   MRN: [id number]  HPI  Ms Sebring is a 88 y/o female with a history of hyperlipidemia, HTN, thyroid disease, aortic stenosis, COPD, dementia and chronic heart failure.   Echo report from 12/20/21 reviewed and showed an EF of 60-65% along with mild/moderate LAE/RAE, moderate MR, mild/moderate AR and moderate/severe TR. Echo report from 10/13/21 reviewed and showed an EF of 65-70% along with mild LVH/ LAE, moderate MR and mild/moderate AR.   Admitted 12/19/21 due to AF RVR and HF exacerbation. Diltiazem and IV lasix started. Cardiology consult obtained. Patient developed AV block with a pause of 4 seconds, diltiazem discontinued. Discharged after 3 days. Admitted 10/11/21 due to shortness of breath. Initially given IV lasix with transition to oral diuretics. Found to be covid +. Cardiology consult obtained. Received 5 days of reduced paxlovid. Elevated troponin thought to be due to demand ischemia. Discharged after 3 days.   She presents today for a follow-up visit with a chief complaint of moderate fatigue with minimal exertion. Describes this as chronic in nature having been present for several years. She does feel like this is improving since she was last here. She has associated shortness of breath, pedal edema (improving) and easy bruising along with this. She denies any difficulty sleeping, abdominal distention, palpitations, chest pain, cough, dizziness, confusion or weight gain.   Since her metoprolol was stopped and changed to diltiazem, she feels much better and her daughter says that she's more like her normal self. Patient is smiling upon entering the room today and does look like she's feeling better.   Past Medical History:  Diagnosis Date   Aortic stenosis    CHF (congestive heart failure) (HCC)    COPD (chronic obstructive pulmonary disease) (HCC)    Dementia (HCC)    High cholesterol    Hypertension    Thyroid disease     Past Surgical History:  Procedure Laterality Date   APPENDECTOMY     JOINT REPLACEMENT     THYROID SURGERY     Family History  Problem Relation Age of Onset   Arthritis Daughter    Social History   Tobacco Use   Smoking status: Never   Smokeless tobacco: Not on file  Substance Use Topics   Alcohol use: Not on file   Allergies  Allergen Reactions   Penicillins    Prior to Admission medications   Medication Sig Start Date End Date Taking? Authorizing Provider  acetaminophen (TYLENOL) 325 MG tablet Take 650 mg by mouth every 6 (six) hours as needed for headache. For pain   Yes [provider]  albuterol (PROVENTIL HFA;VENTOLIN HFA) 108 (90 Base) MCG/ACT inhaler Inhale 2 puffs into the lungs every 6 (six) hours as needed for wheezing or shortness of breath. Dispense 1 spacer to use with the inhaler 07/15/15  Yes Arnaldo Natal, MD  apixaban (ELIQUIS) 5 MG TABS tablet Take 1 tablet (5 mg total) by mouth 2 (two) times daily. 12/18/21  Yes Benita Boonstra A, FNP  BREO ELLIPTA 100-25 MCG/ACT AEPB 1 puff daily. 09/24/21  Yes [provider]  diltiazem (CARDIZEM CD) 120 MG 24 hr capsule Take 1 capsule (120 mg total) by mouth daily. 01/29/22  Yes Clarisa Kindred A, FNP  furosemide (LASIX) 20 MG tablet Take 1 tablet (20 mg total) by mouth daily. 10/15/21  Yes Charise Killian, MD  isosorbide mononitrate (IMDUR) 30 MG 24 hr tablet Take 30 mg  by mouth daily. 05/08/21  Yes [provider]  levothyroxine (SYNTHROID, LEVOTHROID) 50 MCG tablet Take 50 mcg by mouth daily.   Yes [provider]  mometasone-formoterol (DULERA) 100-5 MCG/ACT AERO Inhale 2 puffs into the lungs 2 (two) times daily. Patient not taking: Reported on 02/18/2022 01/16/22   [provider]   Review of Systems  Constitutional:  Positive for fatigue (improving). Negative for appetite change.  HENT:  Negative for congestion, postnasal drip and sore throat.   Eyes: Negative.    Respiratory:  Positive for shortness of breath. Negative for cough and chest tightness.   Cardiovascular:  Positive for leg swelling (improving). Negative for chest pain and palpitations.  Gastrointestinal:  Negative for abdominal distention and abdominal pain.  Endocrine: Negative.   Genitourinary: Negative.   Musculoskeletal:  Negative for back pain.  Skin: Negative.   Allergic/Immunologic: Negative.   Neurological:  Negative for dizziness and light-headedness.  Hematological:  Negative for adenopathy. Bruises/bleeds easily.  Psychiatric/Behavioral:  Negative for confusion, dysphoric mood and sleep disturbance (sleeping on 1 pillow). The patient is not nervous/anxious.    Vitals:   02/18/22 1439  BP: 120/87  Pulse: 73  Resp: 16  SpO2: 97%  Weight: 153 lb 8 oz (69.6 kg)  Height: 5\' 7"  (1.702 m)   Wt Readings from Last 3 Encounters:  02/18/22 153 lb 8 oz (69.6 kg)  01/21/22 165 lb (74.8 kg)  12/22/21 160 lb 7.9 oz (72.8 kg)   Lab Results  Component Value Date   CREATININE 0.91 12/20/2021   CREATININE 0.88 12/19/2021   CREATININE 0.66 10/14/2021   Physical Exam Vitals and nursing note reviewed. Exam conducted with a chaperone present (daughter).  Constitutional:      Appearance: Normal appearance.  HENT:     Head: Normocephalic and atraumatic.  Cardiovascular:     Rate and Rhythm: Normal rate. Rhythm irregular.  Pulmonary:     Effort: Pulmonary effort is normal. No respiratory distress.     Breath sounds: No wheezing or rales.  Abdominal:     General: There is no distension.     Palpations: Abdomen is soft.     Tenderness: There is no abdominal tenderness.  Musculoskeletal:        General: No tenderness.     Cervical back: Normal range of motion and neck supple.     Right lower leg: No tenderness. Edema (trace pitting) present.     Left lower leg: No tenderness. Edema (trace pitting) present.  Skin:    General: Skin is warm and dry.  Neurological:     General:  No focal deficit present.     Mental Status: She is alert and oriented to person, place, and time.  Psychiatric:        Mood and Affect: Mood normal.        Behavior: Behavior normal.        Thought Content: Thought content normal.    Assessment & Plan:  1: Chronic heart failure preserved ejection fraction with structural changes (LVH/LAE)- - NYHA class III - euvolemic - weighing daily; reminded to call for an overnight weight gain of > 2 pounds or a weekly weight gain of >5 pounds - weight down 12 pounds since last visit here 1 month ago - not adding much salt to her food; encouraged her to not add any salt to her food; daughter is trying to be mindful of sodium content of foods - BNP 12/19/21 was 815.1  2: HTN- - BP  looks good (120/87) - sees PCP Arlana Pouch)  - BMP 12/20/21 reviewed and showed sodium 140, potassium 3.7, creatinine 0.91 and GFR 57  3: COPD- - using albuterol PRN and breo daily  4: Dementia- - lives with daughter - + short-term memory loss; mental status much improved since metoprolol was stopped and changed to diltiazem - palliative care visit done 01/16/22  5: Atrial fibrillation- - saw cardiology Gwen Pounds) 02/04/22 - taking diltiazem and apixaban   Patient did not bring her medications nor a list. Each medication was verbally reviewed with the patient and she was encouraged to bring the bottles to every visit to confirm accuracy of list.   Return in 4 months, sooner if needed.

## 2022-02-19 ENCOUNTER — Encounter: Payer: Self-pay | Admitting: Family

## 2022-02-23 ENCOUNTER — Other Ambulatory Visit: Payer: Medicare HMO | Admitting: Primary Care

## 2022-02-23 DIAGNOSIS — M7989 Other specified soft tissue disorders: Secondary | ICD-10-CM

## 2022-02-23 DIAGNOSIS — F0390 Unspecified dementia without behavioral disturbance: Secondary | ICD-10-CM

## 2022-02-23 DIAGNOSIS — Z515 Encounter for palliative care: Secondary | ICD-10-CM

## 2022-02-23 DIAGNOSIS — I482 Chronic atrial fibrillation, unspecified: Secondary | ICD-10-CM

## 2022-02-23 NOTE — Progress Notes (Signed)
[facility] Consult Note Telephone: [phone]  Fax: [phone]    Date of encounter: 02/23/22 1:11 PM PATIENT NAME: Ann [name] [address] [address]   [phone] (home)  DOB: 01/17/1920 MRN: [id number] PRIMARY CARE PROVIDER:    Albina Billet, MD,  [address]   [place] [address] [phone]  REFERRING PROVIDER:   Albina Billet, MD [address]   [place],  [place] [address] [phone]  RESPONSIBLE PARTY:    Contact Information     Name Relation Home Work Mobile   [place] A Daughter [phone]     Rhys Martini   [phone]   [place] Jack   [phone]        I met face to face with patient and family in  home. Palliative Care was asked to follow this patient by consultation request of  Albina Billet, MD to address advance care planning and complex medical decision making. This is a follow up visit.                                   ASSESSMENT AND PLAN / RECOMMENDATIONS:   Advance Care Planning/Goals of Care: Goals include to maximize quality of life and symptom management. Patient/health care surrogate gave his/her permission to discuss.Our advance care planning conversation included a discussion about:    The value and importance of advance care planning  Exploration of personal, cultural or spiritual beliefs that might influence medical decisions  Exploration of goals of care in the event of a sudden injury or illness  Identification of a healthcare agent - son Review  advance directive document - not completed MOST , Can f/u with brother and other clinic provider or call for f/u. CODE STATUS: dnr  Symptom Management/Plan:  Alertness; Improved with d/c in dose of CCB  Edema: Has 1 + in R, none in L. Has  had increase of lasix. Discussed K+ in diet  Dyspnea: [name] is not paid for, request PCP to prescribe HFA so she can use spacer.  Not sure what she was on prior. Insurance pays for [facility] but no spacer device for that delivery system. Prefers not to use multiple daily doses of nebs . Lungs clear today. PCP to please address as  I don't have history of device use.  Mobility; Has walkers but prefers canes. Using BSC to ambulate less at night Nutrition: Poor fluid intake, discussed, Uses miralax and diet for occ constipation. Encourage fluid intake   Follow up Palliative Care Visit: Palliative care will continue to follow for complex medical decision making, advance care planning, and clarification of goals. Return  prn.  I spent 60 minutes providing this consultation. More than 50% of the time in this consultation was spent in counseling and care coordination.  PPS: 40%  HOSPICE ELIGIBILITY/DIAGNOSIS: TBD  Chief Complaint: edema  HISTORY OF PRESENT ILLNESS:  Ann [name] is a 88 y.o. year old female  with CHF, edema . Patient seen today to review palliative care needs to include medical decision making and advance care planning as appropriate.   History obtained from review of EMR, discussion with primary team, and interview with family, facility staff/caregiver and/or Ms. Mowatt.  I reviewed available labs, medications, imaging, studies and related documents from the EMR.  Records reviewed and summarized above.   ROS   General: NAD ENMT: denies dysphagia Cardiovascular:  denies chest pain, denies DOE Pulmonary: denies cough, denies increased SOB Abdomen: endorses good appetite, endorses occ  constipation, endorses continence of bowel GU: denies dysuria, endorses continence of urine MSK:  denies  increased weakness,  no falls reported, uses cane Skin: denies rashes or wounds,  Neurological: denies pain, denies insomnia Psych: Endorses positive mood  Physical Exam: Current and past weights: 153 lbs at clinic, now 150 lbs. Constitutional: NAD General: frail appearing,WNWD EYES: anicteric sclera, lids intact, no  discharge  ENMT: hard of hearing, oral mucous membranes moist, dentition intact CV: S1S2, RRR, R le 1+ LE edema Pulmonary: LCTA, no increased work of breathing, no cough, room air Abdomen: intake 80%, normo-active BS + 4 quadrants, soft and non tender, no ascites MSK: + sarcopenia, moves all extremities, ambulatory with cane Skin: warm and dry, no rashes or wounds on visible skin Neuro:  + generalized weakness,  + cognitive impairment, non-anxious affect   Thank you for the opportunity to participate in the care of Ms. Hessler.  The palliative care team will continue to follow. Please call our office at [phone] if we can be of additional assistance.   Jason Coop, NP DNP, AGPCNP-BC  COVID-19 PATIENT SCREENING TOOL Asked and negative response unless otherwise noted:   Have you had symptoms of covid, tested positive or been in contact with someone with symptoms/positive test in the past 5-10 days?

## 2022-03-11 ENCOUNTER — Telehealth: Payer: Self-pay | Admitting: Family

## 2022-03-11 NOTE — Telephone Encounter (Signed)
Returned patients call after receiving a message that they needed advice on lymphapress machine and wanted to know if it would be ok to adjust the settings as its too tight on her ( which they were shown how to do with company) and to go to once a day with the machine as her swelling as went down significantly which we then called her daughter back and advised that was fine to use as needed per [employer].   Narvel Kozub, NT

## 2022-06-20 NOTE — Progress Notes (Unsigned)
Patient ID: Ann [name], female    DOB: [birth year], 89 y.o.   MRN: [id number]  HPI  Ann [name] is a 89 y/o female with a history of hyperlipidemia, HTN, thyroid disease, aortic stenosis, COPD, dementia and chronic heart failure.   Echo report from 12/20/21 reviewed and showed an EF of 60-65% along with mild/moderate LAE/RAE, moderate MR, mild/moderate AR and moderate/severe TR. Echo report from 10/13/21 reviewed and showed an EF of 65-70% along with mild LVH/ LAE, moderate MR and mild/moderate AR.   Admitted 12/19/21 due to AF RVR and HF exacerbation. Diltiazem and IV lasix started. Cardiology consult obtained. Patient developed AV block with a pause of 4 seconds, diltiazem discontinued. Discharged after 3 days.   She presents today for a follow-up visit with a chief complaint of moderate fatigue with minimal exertion. Describes this as chronic in nature although daughter feels like this is improving. She has associated shortness of breath, wheezing, pedal edema (improving) & easy bruising along with this. Denies any difficulty sleeping, abdominal distention, palpitations, chest pain, cough, dizziness or weight gain.    Was given a Breztri sample by her PCP for the expiratory wheezing and she feels like this has helped although recently has run out of it and the wheezing appears to be worsening.   Past Medical History:  Diagnosis Date   Aortic stenosis    CHF (congestive heart failure) (HCC)    COPD (chronic obstructive pulmonary disease) (HCC)    Dementia (HCC)    High cholesterol    Hypertension    Thyroid disease    Past Surgical History:  Procedure Laterality Date   APPENDECTOMY     JOINT REPLACEMENT     THYROID SURGERY     Family History  Problem Relation Age of Onset   Arthritis Daughter    Social History   Tobacco Use   Smoking status: Never   Smokeless tobacco: Not on file  Substance Use Topics   Alcohol use: Not on file   Allergies  Allergen Reactions    Penicillins    Prior to Admission medications   Medication Sig Start Date End Date Taking? Authorizing Provider  acetaminophen (TYLENOL) 325 MG tablet Take 650 mg by mouth every 6 (six) hours as needed for headache. For pain   Yes [provider]  albuterol (PROVENTIL HFA;VENTOLIN HFA) 108 (90 Base) MCG/ACT inhaler Inhale 2 puffs into the lungs every 6 (six) hours as needed for wheezing or shortness of breath. Dispense 1 spacer to use with the inhaler 07/15/15  Yes Nena Polio, MD  apixaban (ELIQUIS) 5 MG TABS tablet Take 1 tablet (5 mg total) by mouth 2 (two) times daily. Patient taking differently: Take 2.5 mg by mouth 2 (two) times daily. 12/18/21  Yes Cederick Broadnax, Otila Kluver A, FNP  diltiazem (CARDIZEM CD) 120 MG 24 hr capsule Take 1 capsule (120 mg total) by mouth daily. 01/29/22  Yes Darylene Price A, FNP  furosemide (LASIX) 20 MG tablet Take 1 tablet (20 mg total) by mouth daily. Patient taking differently: Take 40 mg by mouth daily. 10/15/21  Yes Wyvonnia Dusky, MD  isosorbide mononitrate (IMDUR) 30 MG 24 hr tablet Take 30 mg by mouth daily. 05/08/21  Yes [provider]  levothyroxine (SYNTHROID, LEVOTHROID) 50 MCG tablet Take 50 mcg by mouth daily.   Yes [provider]  Spacer/Aero-Holding Chambers (AEROCHAMBER PLUS FLO-VU MEDIUM) MISC 1 each by Other route once. For use with albuterol   Yes [provider]  Review of Systems  Constitutional:  Positive for fatigue (improving). Negative for appetite change.  HENT:  Negative for congestion, postnasal drip and sore throat.   Eyes: Negative.   Respiratory:  Positive for shortness of breath and wheezing. Negative for cough and chest tightness.   Cardiovascular:  Positive for leg swelling (improving). Negative for chest pain and palpitations.  Gastrointestinal:  Negative for abdominal distention and abdominal pain.  Endocrine: Negative.   Genitourinary: Negative.   Musculoskeletal:  Negative for back pain.   Skin: Negative.   Allergic/Immunologic: Negative.   Neurological:  Negative for dizziness and light-headedness.  Hematological:  Negative for adenopathy. Bruises/bleeds easily.  Psychiatric/Behavioral:  Negative for confusion, dysphoric mood and sleep disturbance (sleeping on 1 pillow). The patient is not nervous/anxious.    Vitals:   06/22/22 1437  BP: 135/82  Pulse: 98  Resp: 16  SpO2: 96%  Weight: 153 lb (69.4 kg)   Wt Readings from Last 3 Encounters:  06/22/22 153 lb (69.4 kg)  02/18/22 153 lb 8 oz (69.6 kg)  01/21/22 165 lb (74.8 kg)   Lab Results  Component Value Date   CREATININE 0.91 12/20/2021   CREATININE 0.88 12/19/2021   CREATININE 0.66 10/14/2021   Physical Exam Vitals and nursing note reviewed. Exam conducted with a chaperone present (daughter).  Constitutional:      Appearance: Normal appearance.  HENT:     Head: Normocephalic and atraumatic.  Cardiovascular:     Rate and Rhythm: Normal rate. Rhythm irregular.  Pulmonary:     Effort: Pulmonary effort is normal. No respiratory distress.     Breath sounds: Examination of the right-upper field reveals wheezing. Examination of the left-upper field reveals wheezing. Wheezing present. No rhonchi or rales.  Abdominal:     General: There is no distension.     Palpations: Abdomen is soft.     Tenderness: There is no abdominal tenderness.  Musculoskeletal:        General: No tenderness.     Cervical back: Normal range of motion and neck supple.     Right lower leg: No tenderness. Edema (trace pitting) present.     Left lower leg: No tenderness. Edema (trace pitting) present.  Skin:    General: Skin is warm and dry.  Neurological:     General: No focal deficit present.     Mental Status: She is alert and oriented to person, place, and time.  Psychiatric:        Mood and Affect: Mood normal.        Behavior: Behavior normal.        Thought Content: Thought content normal.    Assessment & Plan:  1:  Chronic heart failure preserved ejection fraction with structural changes (LVH/LAE)- - NYHA class III - euvolemic - weighing daily; reminded to call for an overnight weight gain of > 2 pounds or a weekly weight gain of >5 pounds - weight unchanged since last visit here 4 months ago - not adding much salt to her food; encouraged her to not add any salt to her food; daughter is trying to be mindful of sodium content of foods - attempted to do ReDs clip reading but unable to get the reading - offered to do CXR as patient is getting ready to go to the beach but daughter declines  - BNP 12/19/21 was 815.1 - PharmD reconciled medications with daughter  2: HTN- - BP 135/82 - sees PCP Arlana Pouch)  - BMP 12/20/21 reviewed and showed sodium 140,  potassium 3.7, creatinine 0.91 and GFR 57  3: COPD- - using albuterol PRN  - daughter called PCP's office & they are going to call in Breztri to patient's pharmacy  4: Dementia- - lives with daughter - + short-term memory loss; mental status much improved since metoprolol was stopped and changed to diltiazem - palliative care visit done 02/23/22  5: Atrial fibrillation- - saw cardiology Clayborn Bigness) 04/09/22 - taking diltiazem and apixaban   Medications reviewed.   Return in 3 months, sooner if needed.

## 2022-06-22 ENCOUNTER — Ambulatory Visit: Payer: Medicare HMO | Attending: Family | Admitting: Family

## 2022-06-22 ENCOUNTER — Encounter: Payer: Self-pay | Admitting: Pharmacy Technician

## 2022-06-22 ENCOUNTER — Encounter: Payer: Self-pay | Admitting: Family

## 2022-06-22 VITALS — BP 135/82 | HR 98 | Resp 16 | Wt 153.0 lb

## 2022-06-22 DIAGNOSIS — Z7901 Long term (current) use of anticoagulants: Secondary | ICD-10-CM | POA: Insufficient documentation

## 2022-06-22 DIAGNOSIS — E079 Disorder of thyroid, unspecified: Secondary | ICD-10-CM | POA: Insufficient documentation

## 2022-06-22 DIAGNOSIS — F03A0 Unspecified dementia, mild, without behavioral disturbance, psychotic disturbance, mood disturbance, and anxiety: Secondary | ICD-10-CM

## 2022-06-22 DIAGNOSIS — I4891 Unspecified atrial fibrillation: Secondary | ICD-10-CM | POA: Insufficient documentation

## 2022-06-22 DIAGNOSIS — J449 Chronic obstructive pulmonary disease, unspecified: Secondary | ICD-10-CM | POA: Diagnosis not present

## 2022-06-22 DIAGNOSIS — I110 Hypertensive heart disease with heart failure: Secondary | ICD-10-CM | POA: Diagnosis not present

## 2022-06-22 DIAGNOSIS — I5032 Chronic diastolic (congestive) heart failure: Secondary | ICD-10-CM | POA: Insufficient documentation

## 2022-06-22 DIAGNOSIS — I082 Rheumatic disorders of both aortic and tricuspid valves: Secondary | ICD-10-CM | POA: Insufficient documentation

## 2022-06-22 DIAGNOSIS — I10 Essential (primary) hypertension: Secondary | ICD-10-CM

## 2022-06-22 DIAGNOSIS — I4819 Other persistent atrial fibrillation: Secondary | ICD-10-CM

## 2022-06-22 DIAGNOSIS — F0390 Unspecified dementia without behavioral disturbance: Secondary | ICD-10-CM | POA: Insufficient documentation

## 2022-06-22 NOTE — Patient Instructions (Signed)
Continue weighing daily and call for an overnight weight gain of 3 pounds or more or a weekly weight gain of more than 5 pounds.   If you have voicemail, please make sure your mailbox is cleaned out so that we may leave a message and please make sure to listen to any voicemails.     

## 2022-06-22 NOTE — Progress Notes (Signed)
Ann [name] - PHARMACIST COUNSELING NOTE  Guideline-Directed Medical Therapy/Evidence Based Medicine  ACE/ARB/ARNI:  None Beta Blocker:  None Aldosterone Antagonist:  None Diuretic: Furosemide 20 mg daily SGLT2i:  None  Adherence Assessment  Do you ever forget to take your medication? [] Yes [x] No  Do you ever skip doses due to side effects? [] Yes [x] No  Do you have trouble affording your medicines? [] Yes [x] No  Are you ever unable to pick up your medication due to transportation difficulties? [] Yes [x] No  Do you ever stop taking your medications because you don't believe they are helping? [] Yes [x] No  Do you check your weight daily? [x] Yes [] No   Adherence strategy: Daughter manages medication  Barriers to obtaining medications: None  Vital signs: HR 98, BP 135/82, weight (pounds) 153 ECHO: Date 10/2021, EF 65-70%     Latest Ref Rng & Units 12/20/2021    4:46 AM 12/19/2021   10:49 AM 10/14/2021    6:20 AM  BMP  Glucose 70 - 99 mg/dL 99  95  107   BUN 8 - 23 mg/dL 33  27  22   Creatinine 0.44 - 1.00 mg/dL 0.91  0.88  0.66   Sodium 135 - 145 mmol/L 140  141  134   Potassium 3.5 - 5.1 mmol/L 3.7  4.4  4.1   Chloride 98 - 111 mmol/L 108  107  101   CO2 22 - 32 mmol/L 24  24  26    Calcium 8.9 - 10.3 mg/dL 8.7  9.0  8.9     Past Medical History:  Diagnosis Date   Aortic stenosis    CHF (congestive heart failure) (HCC)    COPD (chronic obstructive pulmonary disease) (HCC)    Dementia (HCC)    High cholesterol    Hypertension    Thyroid disease     ASSESSMENT 89 year old female with PMH HTN, Afib, VHD who presents to the HF clinic for follow-up. Most recent ECHO in 10/2021 shows EF 65-70%. Patient is not taking any GDMT. Patient was hospitalized in 12/2021 for NOAF. Originally attempted to switch to metoprolol due to concerns over diltiazem ISO CHF, but patient did not tolerate metoprolol and was switched back to diltiazem, and  since then there have been no issues.  Patient was recently given a sample of Breztri by PCP and used until it ran out. Since running out, wheezing has increased, requiring increased use of albuterol. Counseled patient and her daughter to contact PCP to follow-up about a potential prescription for [facility] or other maintenance inhaler.  Recent ED Visit (past 6 months):  Date - 12/19/2021, CC - Afib  PLAN CHF/HTN Continue furosemide 20 mg daily and Imdur 30 mg daily Hesitant to recommend initiation or escalation of GDMT due to concerns over hypotension and fall risk  Afib CHADSVASc 5 Continue apixaban and diltiazem   Time spent: 15 minutes  Will M. Ouida Sills, PharmD PGY-1 Pharmacy Resident 06/22/2022 8:40 PM    Current Outpatient Medications:    acetaminophen (TYLENOL) 325 MG tablet, Take 650 mg by mouth every 6 (six) hours as needed for headache. For pain, Disp: , Rfl:    albuterol (PROVENTIL HFA;VENTOLIN HFA) 108 (90 Base) MCG/ACT inhaler, Inhale 2 puffs into the lungs every 6 (six) hours as needed for wheezing or shortness of breath. Dispense 1 spacer to use with the inhaler, Disp: 1 Inhaler, Rfl: 2   apixaban (ELIQUIS) 5 MG TABS tablet, Take 1 tablet (5 mg total) by  mouth 2 (two) times daily. (Patient taking differently: Take 2.5 mg by mouth 2 (two) times daily.), Disp: 60 tablet, Rfl: 5   diltiazem (CARDIZEM CD) 120 MG 24 hr capsule, Take 1 capsule (120 mg total) by mouth daily., Disp: 30 capsule, Rfl: 5   furosemide (LASIX) 20 MG tablet, Take 1 tablet (20 mg total) by mouth daily. (Patient taking differently: Take 40 mg by mouth daily.), Disp: 30 tablet, Rfl: 0   isosorbide mononitrate (IMDUR) 30 MG 24 hr tablet, Take 30 mg by mouth daily., Disp: , Rfl:    levothyroxine (SYNTHROID, LEVOTHROID) 50 MCG tablet, Take 50 mcg by mouth daily., Disp: , Rfl:    Spacer/Aero-Holding Chambers (AEROCHAMBER PLUS FLO-VU MEDIUM) MISC, 1 each by Other route once. For use with albuterol, Disp: , Rfl:     DRUGS TO CAUTION IN HEART FAILURE  Drug or Class Mechanism  Analgesics NSAIDs COX-2 inhibitors Glucocorticoids  Sodium and water retention, increased systemic vascular resistance, decreased response to diuretics   Diabetes Medications Metformin Thiazolidinediones Rosiglitazone (Avandia) Pioglitazone (Actos) DPP4 Inhibitors Saxagliptin (Onglyza) Sitagliptin (Januvia)   Lactic acidosis Possible calcium channel blockade   Unknown  Antiarrhythmics Class I  Flecainide Disopyramide Class III Sotalol Other Dronedarone  Negative inotrope, proarrhythmic   Proarrhythmic, beta blockade  Negative inotrope  Antihypertensives Alpha Blockers Doxazosin Calcium Channel Blockers Diltiazem Verapamil Nifedipine Central Alpha Adrenergics Moxonidine Peripheral Vasodilators Minoxidil  Increases renin and aldosterone  Negative inotrope    Possible sympathetic withdrawal  Unknown  Anti-infective Itraconazole Amphotericin B  Negative inotrope Unknown  Hematologic Anagrelide Cilostazol   Possible inhibition of PD IV Inhibition of PD III causing arrhythmias  Neurologic/Psychiatric Stimulants Anti-Seizure Drugs Carbamazepine Pregabalin Antidepressants Tricyclics Citalopram Parkinsons Bromocriptine Pergolide Pramipexole Antipsychotics Clozapine Antimigraine Ergotamine Methysergide Appetite suppressants Bipolar Lithium  Peripheral alpha and beta agonist activity  Negative inotrope and chronotrope Calcium channel blockade  Negative inotrope, proarrhythmic Dose-dependent QT prolongation  Excessive serotonin activity/valvular damage Excessive serotonin activity/valvular damage Unknown  IgE mediated hypersensitivy, calcium channel blockade  Excessive serotonin activity/valvular damage Excessive serotonin activity/valvular damage Valvular damage  Direct myofibrillar degeneration, adrenergic stimulation   Antimalarials Chloroquine Hydroxychloroquine Intracellular inhibition of lysosomal enzymes  Urologic Agents Alpha Blockers Doxazosin Prazosin Tamsulosin Terazosin  Increased renin and aldosterone  Adapted from Page Carleene Overlie, et al. "Drugs That May Cause or Exacerbate Heart Failure: A Scientific Statement from the American Heart  Association." Circulation 2016; 134:e32-e69. DOI: 10.1161/CIR.0000000000000426   MEDICATION ADHERENCES TIPS AND STRATEGIES Taking medication as prescribed improves patient outcomes in heart failure (reduces hospitalizations, improves symptoms, increases survival) Side effects of medications can be managed by decreasing doses, switching agents, stopping drugs, or adding additional therapy. Please let someone in the [facility] know if you have having bothersome side effects so we can modify your regimen. Do not alter your medication regimen without talking to [place].  Medication reminders can help patients remember to take drugs on time. If you are missing or forgetting doses you can try linking behaviors, using pill boxes, or an electronic reminder like an alarm on your phone or an app. Some people can also get automated phone calls as medication reminders.

## 2022-07-23 ENCOUNTER — Other Ambulatory Visit: Payer: Self-pay | Admitting: Family

## 2022-09-22 ENCOUNTER — Encounter: Payer: Medicare HMO | Admitting: Family

## 2022-09-23 ENCOUNTER — Ambulatory Visit: Payer: Medicare HMO | Attending: Family | Admitting: Family

## 2022-09-23 ENCOUNTER — Encounter: Payer: Self-pay | Admitting: Family

## 2022-09-23 VITALS — BP 114/83 | HR 70 | Wt 155.0 lb

## 2022-09-23 DIAGNOSIS — I4819 Other persistent atrial fibrillation: Secondary | ICD-10-CM

## 2022-09-23 DIAGNOSIS — I082 Rheumatic disorders of both aortic and tricuspid valves: Secondary | ICD-10-CM | POA: Diagnosis not present

## 2022-09-23 DIAGNOSIS — Z7901 Long term (current) use of anticoagulants: Secondary | ICD-10-CM | POA: Insufficient documentation

## 2022-09-23 DIAGNOSIS — E079 Disorder of thyroid, unspecified: Secondary | ICD-10-CM | POA: Insufficient documentation

## 2022-09-23 DIAGNOSIS — I10 Essential (primary) hypertension: Secondary | ICD-10-CM | POA: Diagnosis not present

## 2022-09-23 DIAGNOSIS — I509 Heart failure, unspecified: Secondary | ICD-10-CM | POA: Diagnosis not present

## 2022-09-23 DIAGNOSIS — I5032 Chronic diastolic (congestive) heart failure: Secondary | ICD-10-CM | POA: Diagnosis not present

## 2022-09-23 DIAGNOSIS — I110 Hypertensive heart disease with heart failure: Secondary | ICD-10-CM | POA: Insufficient documentation

## 2022-09-23 DIAGNOSIS — F03A0 Unspecified dementia, mild, without behavioral disturbance, psychotic disturbance, mood disturbance, and anxiety: Secondary | ICD-10-CM

## 2022-09-23 DIAGNOSIS — Z79899 Other long term (current) drug therapy: Secondary | ICD-10-CM | POA: Diagnosis not present

## 2022-09-23 DIAGNOSIS — I428 Other cardiomyopathies: Secondary | ICD-10-CM | POA: Insufficient documentation

## 2022-09-23 DIAGNOSIS — J449 Chronic obstructive pulmonary disease, unspecified: Secondary | ICD-10-CM | POA: Insufficient documentation

## 2022-09-23 DIAGNOSIS — I4891 Unspecified atrial fibrillation: Secondary | ICD-10-CM | POA: Insufficient documentation

## 2022-09-23 DIAGNOSIS — F0390 Unspecified dementia without behavioral disturbance: Secondary | ICD-10-CM | POA: Diagnosis not present

## 2022-09-23 NOTE — Progress Notes (Addendum)
Patient ID: Ann [name], female    DOB: [birth year], 89 y.o.   MRN: [id number]  Primary cardiologist: Dorothyann Peng, MD (last seen 11/23; returns 05/24) PCP: Jaclyn Shaggy, MD (last seen 03/24)  HPI  Ann [name] is a 89 y/o female with a history of hyperlipidemia, HTN, thyroid disease, aortic stenosis, COPD, dementia and chronic heart failure.   Echo 12/20/21: EF of 60-65% along with mild/moderate LAE/RAE, moderate MR, mild/moderate AR and moderate/severe TR. Echo 10/13/21: EF of 65-70% along with mild LVH/ LAE, moderate MR and mild/moderate AR.   Has not been admitted or been in the ED in the last 6 months.   She presents today for a HF follow-up visit with a chief complaint of moderate fatigue with minimal exertion. Chronic in nature. Has SOB and pedal edema along with this. Denies difficulty sleeping, dizziness, abdominal distention, palpitations, chest pain, wheezing, cough or weight gain.   Has noticed a little more fatigue recently although is unsure why.   Past Medical History:  Diagnosis Date   Aortic stenosis    CHF (congestive heart failure) (HCC)    COPD (chronic obstructive pulmonary disease) (HCC)    Dementia (HCC)    High cholesterol    Hypertension    Thyroid disease    Past Surgical History:  Procedure Laterality Date   APPENDECTOMY     JOINT REPLACEMENT     THYROID SURGERY     Family History  Problem Relation Age of Onset   Arthritis Daughter    Social History   Tobacco Use   Smoking status: Never   Smokeless tobacco: Not on file  Substance Use Topics   Alcohol use: Not on file   Allergies  Allergen Reactions   Penicillins    Prior to Admission medications   Medication Sig Start Date End Date Taking? Authorizing Provider  acetaminophen (TYLENOL) 325 MG tablet Take 650 mg by mouth every 6 (six) hours as needed for headache. For pain   Yes [provider]  albuterol (PROVENTIL HFA;VENTOLIN HFA) 108 (90 Base) MCG/ACT inhaler Inhale 2 puffs  into the lungs every 6 (six) hours as needed for wheezing or shortness of breath. Dispense 1 spacer to use with the inhaler 07/15/15  Yes Arnaldo Natal, MD  apixaban (ELIQUIS) 5 MG TABS tablet Take 1 tablet (5 mg total) by mouth 2 (two) times daily. Patient taking differently: Take 2.5 mg by mouth 2 (two) times daily. 12/18/21  Yes Delma Freeze, FNP  diltiazem (CARTIA XT) 120 MG 24 hr capsule Take 1 capsule by mouth once daily 07/23/22  Yes Terren Jandreau A, FNP  furosemide (LASIX) 40 MG tablet Take 40 mg by mouth daily.   Yes [provider]  isosorbide mononitrate (IMDUR) 30 MG 24 hr tablet Take 30 mg by mouth daily. 05/08/21  Yes [provider]  levothyroxine (SYNTHROID, LEVOTHROID) 50 MCG tablet Take 50 mcg by mouth daily.   Yes [provider]  Spacer/Aero-Holding Chambers (AEROCHAMBER PLUS FLO-VU MEDIUM) MISC 1 each by Other route once. For use with albuterol   Yes [provider]    Review of Systems  Constitutional:  Positive for fatigue. Negative for appetite change.  HENT:  Negative for congestion, postnasal drip and sore throat.   Eyes: Negative.   Respiratory:  Positive for shortness of breath (at times). Negative for cough, chest tightness and wheezing.   Cardiovascular:  Positive for leg swelling (improving). Negative for chest pain and palpitations.  Gastrointestinal:  Negative for  abdominal distention and abdominal pain.  Endocrine: Negative.   Genitourinary: Negative.   Musculoskeletal:  Negative for back pain.  Skin: Negative.   Allergic/Immunologic: Negative.   Neurological:  Negative for dizziness and light-headedness.  Hematological:  Negative for adenopathy. Bruises/bleeds easily.  Psychiatric/Behavioral:  Negative for confusion, dysphoric mood and sleep disturbance (sleeping on 1 pillow). The patient is not nervous/anxious.    Vitals:   09/23/22 1508  BP: 114/83  Pulse: 70  SpO2: 99%  Weight: 155 lb (70.3 kg)   Wt Readings  from Last 3 Encounters:  09/23/22 155 lb (70.3 kg)  06/22/22 153 lb (69.4 kg)  02/18/22 153 lb 8 oz (69.6 kg)   Lab Results  Component Value Date   CREATININE 0.91 12/20/2021   CREATININE 0.88 12/19/2021   CREATININE 0.66 10/14/2021   Physical Exam Vitals and nursing note reviewed. Exam conducted with a chaperone present (daughter).  Constitutional:      Appearance: Normal appearance.  HENT:     Head: Normocephalic and atraumatic.  Cardiovascular:     Rate and Rhythm: Normal rate. Rhythm irregular.  Pulmonary:     Effort: Pulmonary effort is normal. No respiratory distress.     Breath sounds: No wheezing, rhonchi or rales.  Abdominal:     General: There is no distension.     Palpations: Abdomen is soft.     Tenderness: There is no abdominal tenderness.  Musculoskeletal:        General: No tenderness.     Cervical back: Normal range of motion and neck supple.     Right lower leg: No tenderness. Edema (trace pitting) present.     Left lower leg: No tenderness. Edema (trace pitting) present.  Skin:    General: Skin is warm and dry.  Neurological:     General: No focal deficit present.     Mental Status: She is alert and oriented to person, place, and time.  Psychiatric:        Mood and Affect: Mood normal.        Behavior: Behavior normal.        Thought Content: Thought content normal.   Assessment & Plan:  1: NICM with preserved ejection fraction- - NYHA class III - euvolemic - weighing daily; reminded to call for an overnight weight gain of > 2 pounds or a weekly weight gain of >5 pounds - weight up 2 pounds since last visit here 3 months ago - Echo 12/20/21: EF of 60-65% along with mild/moderate LAE/RAE, moderate MR, mild/moderate AR and moderate/severe TR. Echo 10/13/21: EF of 65-70% along with mild LVH/ LAE, moderate MR and mild/moderate AR.  - not adding much salt to her food; encouraged her to not add any salt to her food; daughter is trying to be mindful of sodium  content of foods - continue furosemide 40mg  daily - continue isosorbide MN 30mg  daily - BNP 12/19/21 was 815.1  2: HTN- - BP 114/83 - saw PCP Arlana Pouch) 03/24 - BMP 04/06/22 reviewed and showed sodium 142, potassium 4.0, creatinine 0.92 and GFR 56  3: COPD- - using albuterol PRN  - continue breztri  4: Dementia- - lives with daughter - + short-term memory loss - palliative care visit done 02/23/22  5: Atrial fibrillation- - saw cardiology Juliann Pares) 11/23 - continue apixaban 2.5mg  BID - continue diltiazem 120mg  daily   Due to HF stability, will not make a return appt at this time. Advised patient and her daughter to follow closely with cardiology and PCP  but that they could return at anytime and they were comfortable with this plan.

## 2022-09-23 NOTE — Patient Instructions (Signed)
Call us in the future if you need us for anything 

## 2022-12-07 ENCOUNTER — Encounter: Payer: Medicare HMO | Admitting: Family

## 2022-12-07 ENCOUNTER — Encounter: Payer: Self-pay | Admitting: Family

## 2022-12-07 ENCOUNTER — Telehealth: Payer: Self-pay

## 2022-12-07 ENCOUNTER — Ambulatory Visit: Payer: Medicare HMO | Attending: Family | Admitting: Family

## 2022-12-07 VITALS — BP 88/62 | HR 85 | Ht 67.0 in | Wt 158.0 lb

## 2022-12-07 DIAGNOSIS — Z79899 Other long term (current) drug therapy: Secondary | ICD-10-CM | POA: Insufficient documentation

## 2022-12-07 DIAGNOSIS — I4891 Unspecified atrial fibrillation: Secondary | ICD-10-CM | POA: Diagnosis not present

## 2022-12-07 DIAGNOSIS — F0390 Unspecified dementia without behavioral disturbance: Secondary | ICD-10-CM | POA: Diagnosis not present

## 2022-12-07 DIAGNOSIS — F03A0 Unspecified dementia, mild, without behavioral disturbance, psychotic disturbance, mood disturbance, and anxiety: Secondary | ICD-10-CM | POA: Diagnosis not present

## 2022-12-07 DIAGNOSIS — I110 Hypertensive heart disease with heart failure: Secondary | ICD-10-CM | POA: Insufficient documentation

## 2022-12-07 DIAGNOSIS — R609 Edema, unspecified: Secondary | ICD-10-CM | POA: Insufficient documentation

## 2022-12-07 DIAGNOSIS — I428 Other cardiomyopathies: Secondary | ICD-10-CM | POA: Diagnosis present

## 2022-12-07 DIAGNOSIS — J449 Chronic obstructive pulmonary disease, unspecified: Secondary | ICD-10-CM | POA: Diagnosis not present

## 2022-12-07 DIAGNOSIS — I5032 Chronic diastolic (congestive) heart failure: Secondary | ICD-10-CM | POA: Diagnosis not present

## 2022-12-07 DIAGNOSIS — I10 Essential (primary) hypertension: Secondary | ICD-10-CM | POA: Diagnosis not present

## 2022-12-07 DIAGNOSIS — Z7901 Long term (current) use of anticoagulants: Secondary | ICD-10-CM | POA: Insufficient documentation

## 2022-12-07 DIAGNOSIS — I4819 Other persistent atrial fibrillation: Secondary | ICD-10-CM

## 2022-12-07 DIAGNOSIS — I083 Combined rheumatic disorders of mitral, aortic and tricuspid valves: Secondary | ICD-10-CM | POA: Insufficient documentation

## 2022-12-07 NOTE — Patient Instructions (Signed)
Do not give spironolactone today.

## 2022-12-07 NOTE — Progress Notes (Signed)
REDS VEST READING= 27% CHEST RULER=30 HEIGHT MARKER=C

## 2022-12-07 NOTE — Progress Notes (Signed)
PCP: Jaclyn Shaggy, MD (last seen 06/24) Primary Cardiologist: Dorothyann Peng, MD (last seen 05/24)  HPI:  Ann [name] is a 89 y/o female with a history of hyperlipidemia, HTN, thyroid disease, aortic stenosis, COPD, dementia and chronic heart failure.   Echo 12/20/21: EF of 60-65% along with mild/moderate LAE/RAE, moderate MR, mild/moderate AR and moderate/severe TR. Echo 10/13/21: EF of 65-70% along with mild LVH/ LAE, moderate MR and mild/moderate AR.   Has not been admitted or been in the ED in the last 6 months.   She presents today for a HF follow-up visit with a chief complaint of moderate fatigue with minimal exertion. Chronic in nature although worsened over the last couple of months . Has associated SOB, intermittent dizziness & pedal edema along with this. Denies chest pain, palpitations, cough, abdominal distention or weight gain.  Cardiology stopped diltiazem 05/24 and started metoprolol 25mg  once daily which was then decreased to 12.5mg  daily due to increased fatigue. Daughter says that patient was doing well when on diltiazem but has become more fatigued since being switched back to metoprolol. She has also developed worsening pedal edema and her PCP started spironolactone 12.5mg  every other day with the option to increase this to daily. When she took metoprolol once before, she was quite fatigue and "out of it".   ROS: All systems negative except as listed in HPI, PMH and Problem List.  SH:  Social History   Socioeconomic History   Marital status: Married    Spouse name: Not on file   Number of children: Not on file   Years of education: Not on file   Highest education level: Not on file  Occupational History   Not on file  Tobacco Use   Smoking status: Never   Smokeless tobacco: Not on file  Substance and Sexual Activity   Alcohol use: Not on file   Drug use: Not on file   Sexual activity: Not on file  Other Topics Concern   Not on file  Social History Narrative    Not on file   Social Determinants of Health   Financial Resource Strain: Not on file  Food Insecurity: Not on file  Transportation Needs: Not on file  Physical Activity: Not on file  Stress: Not on file  Social Connections: Not on file  Intimate Partner Violence: Not on file    FH:  Family History  Problem Relation Age of Onset   Arthritis Daughter     Past Medical History:  Diagnosis Date   Aortic stenosis    CHF (congestive heart failure) (HCC)    COPD (chronic obstructive pulmonary disease) (HCC)    Dementia (HCC)    High cholesterol    Hypertension    Thyroid disease     Current Outpatient Medications  Medication Sig Dispense Refill   acetaminophen (TYLENOL) 325 MG tablet Take 650 mg by mouth every 6 (six) hours as needed for headache. For pain     albuterol (PROVENTIL HFA;VENTOLIN HFA) 108 (90 Base) MCG/ACT inhaler Inhale 2 puffs into the lungs every 6 (six) hours as needed for wheezing or shortness of breath. Dispense 1 spacer to use with the inhaler 1 Inhaler 2   apixaban (ELIQUIS) 5 MG TABS tablet Take 1 tablet (5 mg total) by mouth 2 (two) times daily. (Patient taking differently: Take 2.5 mg by mouth 2 (two) times daily.) 60 tablet 5   diltiazem (CARTIA XT) 120 MG 24 hr capsule Take 1 capsule by mouth once daily 30  capsule 5   furosemide (LASIX) 40 MG tablet Take 40 mg by mouth daily.     isosorbide mononitrate (IMDUR) 30 MG 24 hr tablet Take 30 mg by mouth daily.     levothyroxine (SYNTHROID, LEVOTHROID) 50 MCG tablet Take 50 mcg by mouth daily.     Spacer/Aero-Holding Chambers (AEROCHAMBER PLUS FLO-VU MEDIUM) MISC 1 each by Other route once. For use with albuterol     No current facility-administered medications for this visit.   Vitals:   12/07/22 1039  BP: (!) 88/62  Pulse: 85  SpO2: 100%  Weight: 158 lb (71.7 kg)  Height: 5\' 7"  (1.702 m)   Wt Readings from Last 3 Encounters:  12/07/22 158 lb (71.7 kg)  09/23/22 155 lb (70.3 kg)  06/22/22 153 lb  (69.4 kg)   Lab Results  Component Value Date   CREATININE 0.91 12/20/2021   CREATININE 0.88 12/19/2021   CREATININE 0.66 10/14/2021   PHYSICAL EXAM:  General:  Well appearing. No resp difficulty HEENT: normal Neck: supple. JVP flat. No lymphadenopathy or thryomegaly appreciated. Cor: PMI normal. Regular rate irregular rhythm. No rubs, gallops or murmurs. Lungs: clear Abdomen: soft, nontender, nondistended. No hepatosplenomegaly. No bruits or masses. Good bowel sounds. Extremities: no cyanosis, clubbing, rash, 1+ pitting edema bilateral lower legs Neuro: alert & oriented x3, cranial nerves grossly intact. Moves all 4 extremities w/o difficulty. Affect pleasant.  ReDs: 27%  Assessment & Plan:  1: NICM with preserved ejection fraction- - suspect due to HTN/ AF - NYHA class III - euvolemic - weighing daily; reminded to call for an overnight weight gain of > 2 pounds or a weekly weight gain of >5 pounds - weight up 3 pounds since last visit here 2.5 months ago - Echo 12/20/21: EF of 60-65% along with mild/moderate LAE/RAE, moderate MR, mild/moderate AR and moderate/severe TR. Echo 10/13/21: EF of 65-70% along with mild LVH/ LAE, moderate MR and mild/moderate AR.  - not adding much salt to her food; encouraged her to not add any salt to her food; daughter is trying to be mindful of sodium content of foods - continue furosemide 40mg  daily - continue isosorbide MN 30mg  daily - continue spironolactone 12.5mg  M,W, F although hold this today due to BP - ReDs 27% - BNP 12/19/21 was 815.1  2: HTN- - BP 88/62 - saw PCP Arlana Pouch) 06/24 & started spironolactone 12.5mg  every other day; hold this today and do not take it again until Wed (today is Monday) - BMP 04/06/22 reviewed and showed sodium 142, potassium 4.0, creatinine 0.92 and GFR 56  3: COPD- - using albuterol PRN  - continue breztri  4: Dementia- - lives with daughter - + short-term memory loss - palliative care visit done  02/23/22  5: Atrial fibrillation- - saw cardiology Juliann Pares) 05/24 and was started on metoprolol succinate 25mg  daily - continue apixaban 2.5mg  BID - will discuss with Dr. Juliann Pares about stopping the metoprolol and resuming diltiazem 120mg  as patient felt better; hopefully will switch this back and her edema will lessen and then the spiro can be stopped.  - will contact daughter after discussing with Dr. Juliann Pares and she was comfortable with this plan  Return in 1 month, sooner if needed.

## 2022-12-07 NOTE — Telephone Encounter (Signed)
-----   Message from Delma Freeze, [place] sent at 12/07/2022  3:26 PM EDT ----- Regarding: medication Please call daughter.   After talking with Dr. Juliann Pares, stop your metoprolol and resume diltiazem 120mg  once daily. Once the swelling improves off the metoprolol, we may be able to stop the spironolactone. If you are able to check your blood pressure, please check it daily and write it down. Call us if the top number is <100.

## 2022-12-21 ENCOUNTER — Encounter: Payer: Medicare HMO | Admitting: Family

## 2023-01-12 NOTE — Progress Notes (Unsigned)
PCP: Ann Shaggy, MD (last seen 06/24) Primary Cardiologist: Ann Peng, MD (last seen 05/24)  HPI:  Ms Ann [name] is a 89 y/o female with a history of hyperlipidemia, HTN, thyroid disease, aortic stenosis, COPD, dementia and chronic heart failure.   Has not been admitted or been in the ED in the last 6 months.   Echo 10/13/21: EF of 65-70% along with mild LVH/ LAE, moderate MR and mild/moderate AR. Echo 12/20/21: EF of 60-65% along with mild/moderate LAE/RAE, moderate MR, mild/moderate AR and moderate/severe TR.    She presents today for a HF follow-up visit with a chief complaint of moderate fatigue with minimal exertion. Chronic in nature although much improved from her last visit. Has associated SOB and very little pedal edema along with this. Denies chest pain, palpitations, abdominal distention, dizziness or difficulty sleeping.   Since last visit her metoprolol was stopped and diltiazem 120mg  resumed and her daughter says that patient is doing much better. Hasn't been taking spironolactone because she has very little swelling and it had been started by PCP due to swelling from the metoprolol.    ROS: All systems negative except as listed in HPI, PMH and Problem List.  SH:  Social History   Socioeconomic History   Marital status: Married    Spouse name: Not on file   Number of children: Not on file   Years of education: Not on file   Highest education level: Not on file  Occupational History   Not on file  Tobacco Use   Smoking status: Never   Smokeless tobacco: Not on file  Substance and Sexual Activity   Alcohol use: Not on file   Drug use: Not on file   Sexual activity: Not on file  Other Topics Concern   Not on file  Social History Narrative   Not on file   Social Determinants of Health   Financial Resource Strain: Not on file  Food Insecurity: Not on file  Transportation Needs: Not on file  Physical Activity: Not on file  Stress: Not on file  Social  Connections: Not on file  Intimate Partner Violence: Not on file    FH:  Family History  Problem Relation Age of Onset   Arthritis Daughter     Past Medical History:  Diagnosis Date   Aortic stenosis    CHF (congestive heart failure) (HCC)    COPD (chronic obstructive pulmonary disease) (HCC)    Dementia (HCC)    High cholesterol    Hypertension    Thyroid disease     Current Outpatient Medications  Medication Sig Dispense Refill   acetaminophen (TYLENOL) 325 MG tablet Take 650 mg by mouth every 6 (six) hours as needed for headache. For pain     albuterol (PROVENTIL HFA;VENTOLIN HFA) 108 (90 Base) MCG/ACT inhaler Inhale 2 puffs into the lungs every 6 (six) hours as needed for wheezing or shortness of breath. Dispense 1 spacer to use with the inhaler 1 Inhaler 2   apixaban (ELIQUIS) 5 MG TABS tablet Take 1 tablet (5 mg total) by mouth 2 (two) times daily. (Patient taking differently: Take 2.5 mg by mouth 2 (two) times daily.) 60 tablet 5   BREZTRI AEROSPHERE 160-9-4.8 MCG/ACT AERO Inhale into the lungs.     fluticasone furoate-vilanterol (BREO ELLIPTA) 100-25 MCG/ACT AEPB Inhale into the lungs.     furosemide (LASIX) 40 MG tablet Take 40 mg by mouth daily.     isosorbide mononitrate (IMDUR) 30 MG 24 hr tablet  Take 30 mg by mouth daily.     levothyroxine (SYNTHROID, LEVOTHROID) 50 MCG tablet Take 50 mcg by mouth daily.     loratadine (CLARITIN) 10 MG tablet Take by mouth.     metoprolol succinate (TOPROL-XL) 25 MG 24 hr tablet Take 12.5 mg by mouth daily.     Spacer/Aero-Holding Chambers (AEROCHAMBER PLUS FLO-VU MEDIUM) MISC 1 each by Other route once. For use with albuterol     spironolactone (ALDACTONE) 25 MG tablet Take 12.5 mg by mouth every other day.     No current facility-administered medications for this visit.   Vitals:   01/13/23 1404  BP: 102/74  Pulse: 71  SpO2: 97%  Weight: 159 lb (72.1 kg)  Height: 5\' 7"  (1.702 m)   Wt Readings from Last 3 Encounters:   01/13/23 159 lb (72.1 kg)  12/07/22 158 lb (71.7 kg)  09/23/22 155 lb (70.3 kg)   Lab Results  Component Value Date   CREATININE 0.91 12/20/2021   CREATININE 0.88 12/19/2021   CREATININE 0.66 10/14/2021   PHYSICAL EXAM:  General:  Well appearing. No resp difficulty HEENT: normal Neck: supple. JVP flat. No lymphadenopathy or thryomegaly appreciated. Cor: PMI normal. Regular rate irregular rhythm. No rubs, gallops or murmurs. Lungs: clear Abdomen: soft, nontender, nondistended. No hepatosplenomegaly. No bruits or masses. Good bowel sounds. Extremities: no cyanosis, clubbing, rash, trace pitting edema bilateral lower legs Neuro: alert & oriented x3, cranial nerves grossly intact. Moves all 4 extremities w/o difficulty. Affect pleasant.   Assessment & Plan:  1: NICM with preserved ejection fraction- - suspect due to HTN/ AF - NYHA class III - euvolemic - weighing daily; reminded to call for an overnight weight gain of > 2 pounds or a weekly weight gain of >5 pounds - weight stable since last visit here 1 month ago - Echo 12/20/21: EF of 60-65% along with mild/moderate LAE/RAE, moderate MR, mild/moderate AR and moderate/severe TR. Echo 10/13/21: EF of 65-70% along with mild LVH/ LAE, moderate MR and mild/moderate AR.  - not adding much salt to her food; encouraged her to not add any salt to her food; daughter is trying to be mindful of sodium content of foods - continue furosemide 40mg  daily - continue isosorbide MN 30mg  daily - do not need to resume spironolactone at this time - BNP 12/19/21 was 815.1  2: HTN- - BP 102/74 - saw PCP Ann [name]) 06/24  - BMP 04/06/22 reviewed and showed sodium 142, potassium 4.0, creatinine 0.92 and GFR 56  3: COPD- - using albuterol PRN  - continue breztri  4: Dementia- - lives with daughter - + short-term memory loss - palliative care visit done 02/23/22  5: Atrial fibrillation- - saw cardiology Ann [name]) 05/24 - continue apixaban 2.5mg   BID - continue diltiazem 120mg  daily - have added metoprolol to her allergy list as she's tried it twice and both times, developed worsening fatigue, SOB, edema and hypotension  Return in 4 months, sooner if needed.

## 2023-01-13 ENCOUNTER — Encounter: Payer: Self-pay | Admitting: Family

## 2023-01-13 ENCOUNTER — Ambulatory Visit: Payer: Medicare HMO | Attending: Family | Admitting: Family

## 2023-01-13 VITALS — BP 102/74 | HR 71 | Ht 67.0 in | Wt 159.0 lb

## 2023-01-13 DIAGNOSIS — F0390 Unspecified dementia without behavioral disturbance: Secondary | ICD-10-CM | POA: Diagnosis not present

## 2023-01-13 DIAGNOSIS — I4891 Unspecified atrial fibrillation: Secondary | ICD-10-CM | POA: Insufficient documentation

## 2023-01-13 DIAGNOSIS — J449 Chronic obstructive pulmonary disease, unspecified: Secondary | ICD-10-CM | POA: Diagnosis not present

## 2023-01-13 DIAGNOSIS — I428 Other cardiomyopathies: Secondary | ICD-10-CM | POA: Insufficient documentation

## 2023-01-13 DIAGNOSIS — I4819 Other persistent atrial fibrillation: Secondary | ICD-10-CM

## 2023-01-13 DIAGNOSIS — F03A0 Unspecified dementia, mild, without behavioral disturbance, psychotic disturbance, mood disturbance, and anxiety: Secondary | ICD-10-CM

## 2023-01-13 DIAGNOSIS — I5032 Chronic diastolic (congestive) heart failure: Secondary | ICD-10-CM

## 2023-01-13 DIAGNOSIS — I10 Essential (primary) hypertension: Secondary | ICD-10-CM

## 2023-01-13 DIAGNOSIS — I110 Hypertensive heart disease with heart failure: Secondary | ICD-10-CM | POA: Insufficient documentation

## 2023-01-13 DIAGNOSIS — R9431 Abnormal electrocardiogram [ECG] [EKG]: Secondary | ICD-10-CM | POA: Diagnosis not present

## 2023-03-10 ENCOUNTER — Other Ambulatory Visit: Payer: Self-pay | Admitting: Family

## 2023-03-10 DIAGNOSIS — I4819 Other persistent atrial fibrillation: Secondary | ICD-10-CM

## 2023-04-16 NOTE — Progress Notes (Deleted)
PCP: Jaclyn Shaggy, MD (last seen 06/24) Primary Cardiologist: Ann Peng, MD (last seen 08/24)  HPI:  Ann [name] is a 89 y/o female with a history of hyperlipidemia, HTN, thyroid disease, aortic stenosis, COPD, dementia and chronic heart failure.   Has not been admitted or been in the ED in the last 6 months.   Echo 10/13/21: EF 65-70% along with mild LVH/ LAE, moderate MR and mild/moderate AR. Echo 12/20/21: EF 60-65% along with mild/moderate LAE/RAE, moderate MR, mild/moderate AR and moderate/severe TR.    She presents today for a HF follow-up visit with a chief complaint of    ROS: All systems negative except as listed in HPI, PMH and Problem List.  SH:  Social History   Socioeconomic History   Marital status: Married    Spouse name: Not on file   Number of children: Not on file   Years of education: Not on file   Highest education level: Not on file  Occupational History   Not on file  Tobacco Use   Smoking status: Never   Smokeless tobacco: Not on file  Substance and Sexual Activity   Alcohol use: Not on file   Drug use: Not on file   Sexual activity: Not on file  Other Topics Concern   Not on file  Social History Narrative   Not on file   Social Determinants of Health   Financial Resource Strain: Not on file  Food Insecurity: Not on file  Transportation Needs: Not on file  Physical Activity: Not on file  Stress: Not on file  Social Connections: Not on file  Intimate Partner Violence: Not on file    FH:  Family History  Problem Relation Age of Onset   Arthritis Daughter     Past Medical History:  Diagnosis Date   Aortic stenosis    CHF (congestive heart failure) (HCC)    COPD (chronic obstructive pulmonary disease) (HCC)    Dementia (HCC)    High cholesterol    Hypertension    Thyroid disease     Current Outpatient Medications  Medication Sig Dispense Refill   acetaminophen (TYLENOL) 325 MG tablet Take 650 mg by mouth every 6 (six) hours  as needed for headache. For pain     albuterol (PROVENTIL HFA;VENTOLIN HFA) 108 (90 Base) MCG/ACT inhaler Inhale 2 puffs into the lungs every 6 (six) hours as needed for wheezing or shortness of breath. Dispense 1 spacer to use with the inhaler 1 Inhaler 2   apixaban (ELIQUIS) 5 MG TABS tablet Take 1 tablet (5 mg total) by mouth 2 (two) times daily. (Patient taking differently: Take 2.5 mg by mouth 2 (two) times daily.) 60 tablet 5   BREZTRI AEROSPHERE 160-9-4.8 MCG/ACT AERO Inhale into the lungs.     diltiazem (CARDIZEM CD) 120 MG 24 hr capsule Take 1 capsule by mouth once daily 90 capsule 0   fluticasone furoate-vilanterol (BREO ELLIPTA) 100-25 MCG/ACT AEPB Inhale into the lungs.     furosemide (LASIX) 40 MG tablet Take 40 mg by mouth daily.     isosorbide mononitrate (IMDUR) 30 MG 24 hr tablet Take 30 mg by mouth daily.     levothyroxine (SYNTHROID, LEVOTHROID) 50 MCG tablet Take 50 mcg by mouth daily.     loratadine (CLARITIN) 10 MG tablet Take by mouth.     Spacer/Aero-Holding Chambers (AEROCHAMBER PLUS FLO-VU MEDIUM) MISC 1 each by Other route once. For use with albuterol     No current facility-administered medications for  this visit.     PHYSICAL EXAM:  General:  Well appearing. No resp difficulty HEENT: normal Neck: supple. JVP flat. No lymphadenopathy or thryomegaly appreciated. Cor: PMI normal. Regular rate irregular rhythm. No rubs, gallops or murmurs. Lungs: clear Abdomen: soft, nontender, nondistended. No hepatosplenomegaly. No bruits or masses. Good bowel sounds. Extremities: no cyanosis, clubbing, rash, trace pitting edema bilateral lower legs Neuro: alert & oriented x3, cranial nerves grossly intact. Moves all 4 extremities w/o difficulty. Affect pleasant.   Assessment & Plan:  1: NICM with preserved ejection fraction- - suspect due to HTN/ AF - NYHA class III - euvolemic - weighing daily; reminded to call for an overnight weight gain of > 2 pounds or a weekly  weight gain of >5 pounds - weight 159 since last visit here 3 months ago - Echo 10/13/21: EF 65-70% along with mild LVH/ LAE, moderate MR and mild/moderate AR.  - Echo 12/20/21: EF 60-65% along with mild/moderate LAE/RAE, moderate MR, mild/moderate AR and moderate/severe TR.  - not adding much salt to her food; encouraged her to not add any salt to her food; daughter is trying to be mindful of sodium content of foods - continue furosemide 40mg  daily - continue isosorbide MN 30mg  daily - do not need to resume spironolactone at this time - BNP 12/19/21 was 815.1  2: HTN- - BP  - saw PCP Arlana Pouch) 06/24  - BMP 04/06/22 reviewed and showed sodium 142, potassium 4.0, creatinine 0.92 and GFR 56  3: COPD- - using albuterol PRN  - continue breztri  4: Dementia- - lives with daughter - + short-term memory loss - palliative care visit done 02/23/22  5: Atrial fibrillation- - saw cardiology Juliann Pares) 08/24 - continue apixaban 2.5mg  BID - continue diltiazem 120mg  daily - have added metoprolol to her allergy list as she's tried it twice and both times, developed worsening fatigue, SOB, edema and hypotension

## 2023-04-19 ENCOUNTER — Encounter: Payer: Medicare HMO | Admitting: Family

## 2023-04-19 ENCOUNTER — Telehealth: Payer: Self-pay | Admitting: Family

## 2023-04-19 NOTE — Telephone Encounter (Signed)
Patient did not show for her [facility] appointment on 04/19/23.

## 2023-05-11 IMAGING — CR DG CHEST 2V
1 series · 2 of 2 positions shown · non-contrast
Comparison: 06/23/2016

CLINICAL DATA: Cough and shortness of breath

EXAM:
CHEST - 2 VIEW

[Series 1: dg chest 2 view · 0.14mm/px · 2 of 2 slices shown]
[im 1/2]
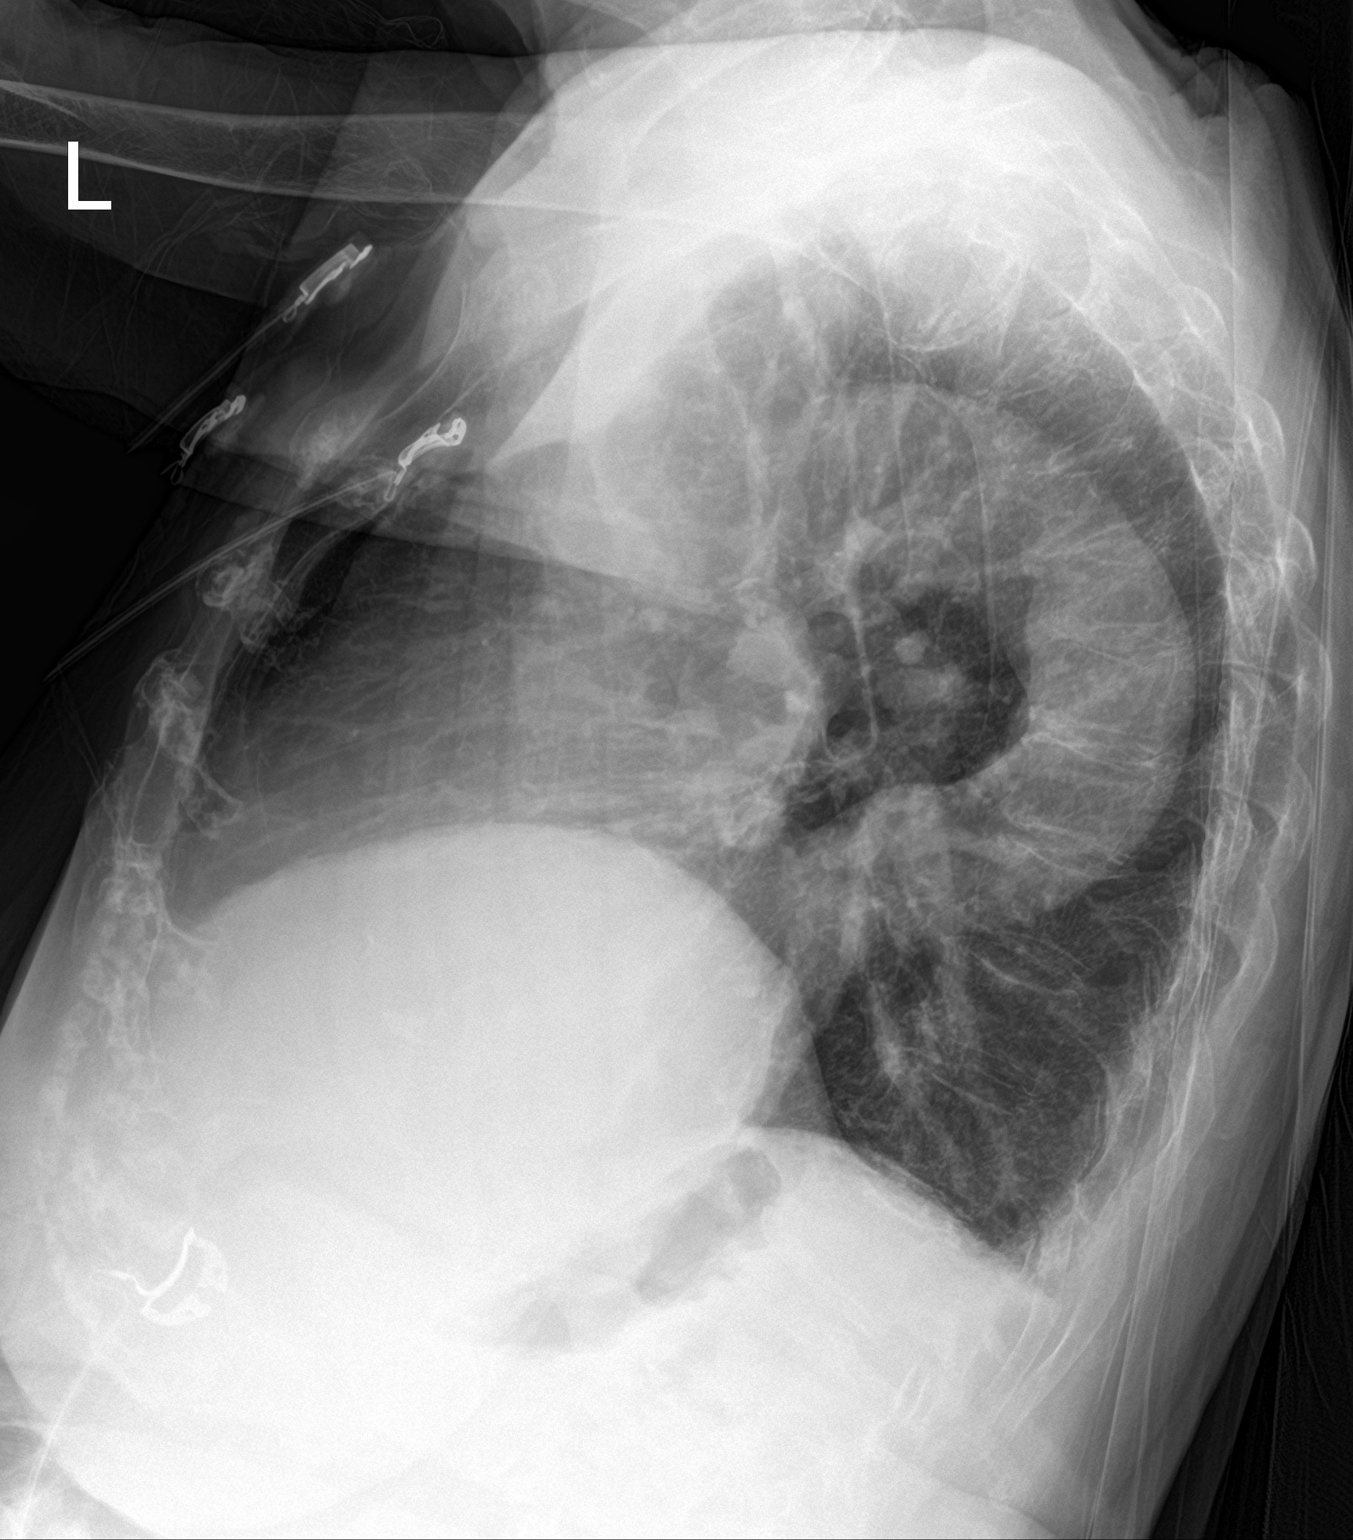
[im 2/2]
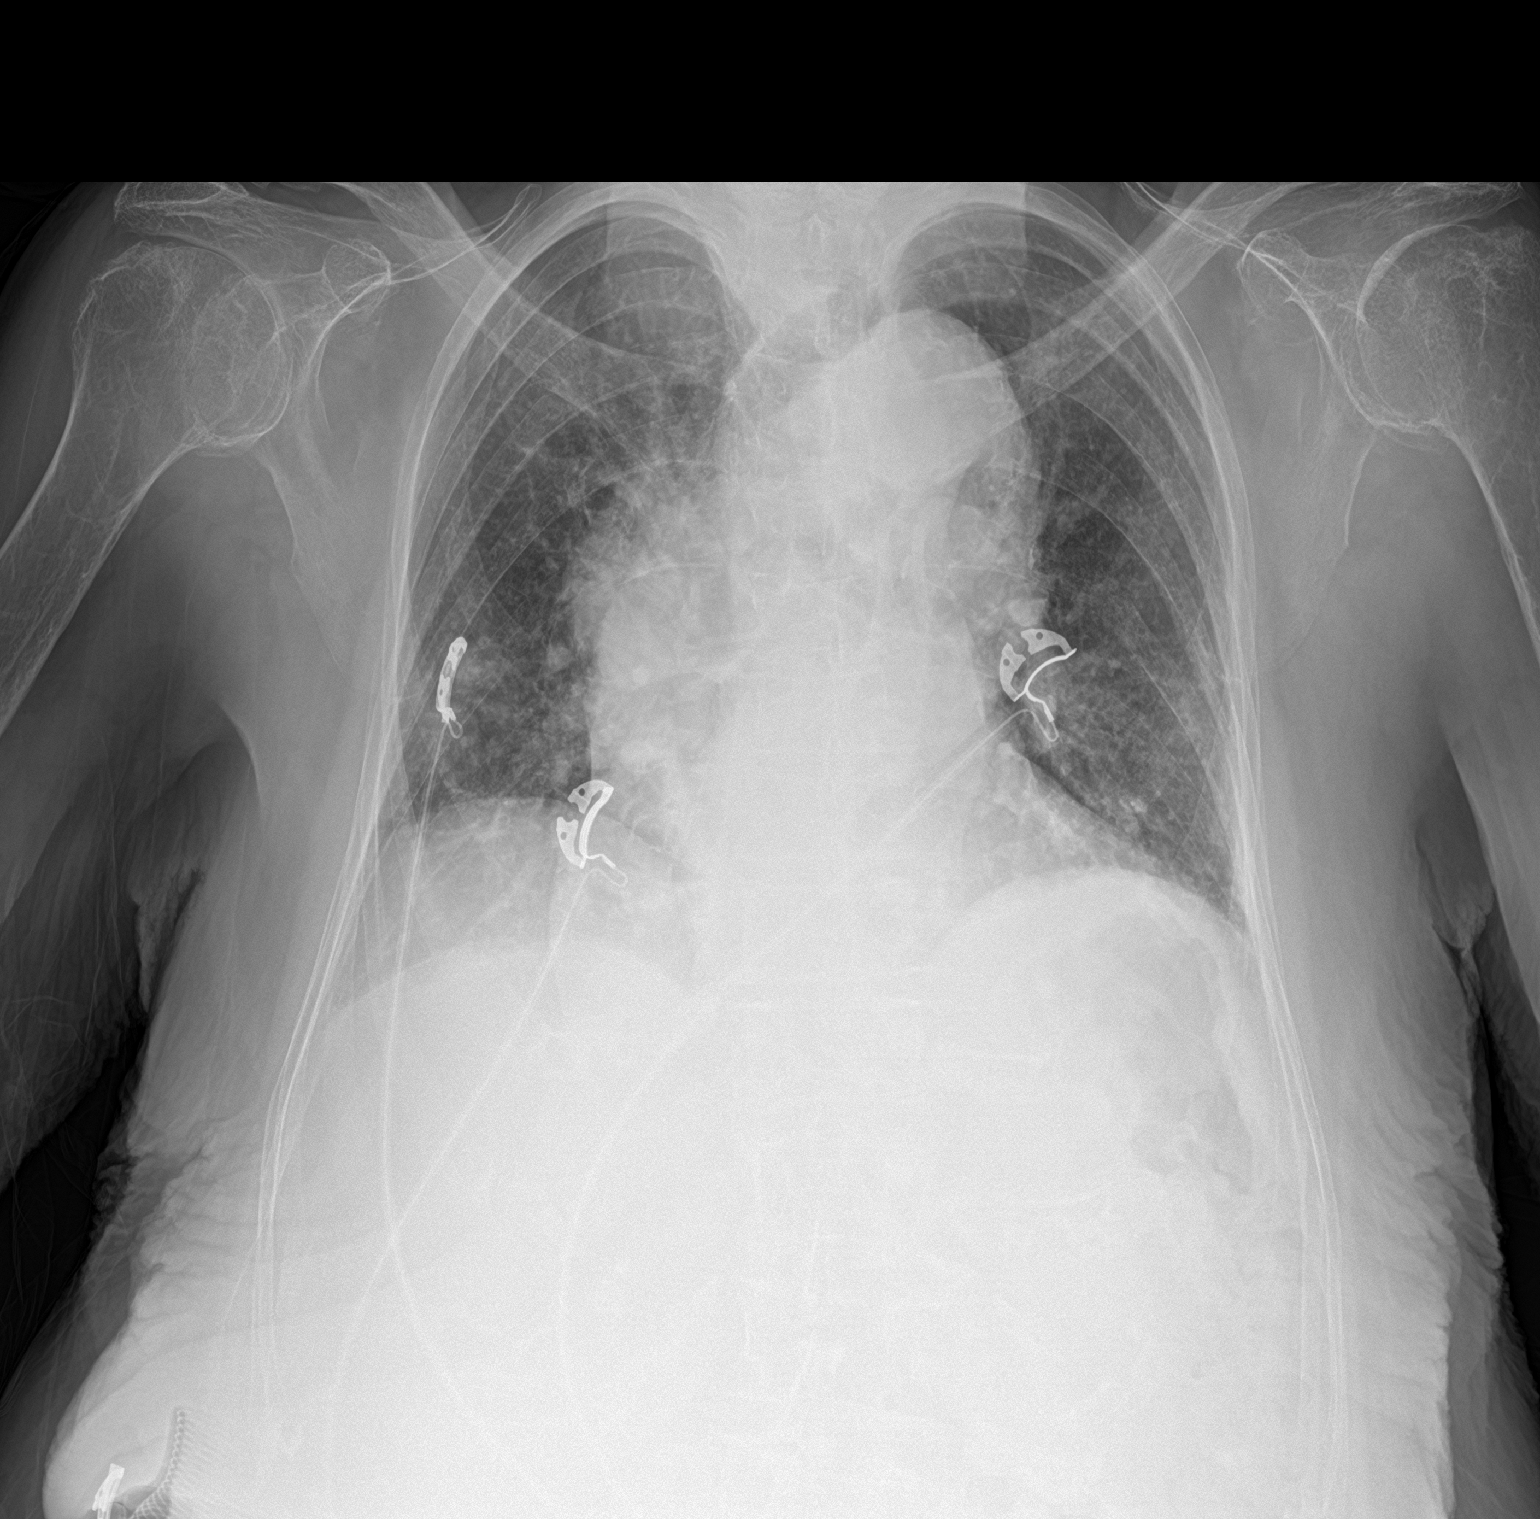

[2 of 2 positions shown; findings below may reference images not displayed]

FINDINGS: Chronic cardiomegaly, aortic atherosclerosis and aortic tortuosity.
Increased interstitial lung markings compared to the previous study
raising the possibility of early pneumonia or interstitial edema. No
pleural effusion. No acute bone finding.
IMPRESSION: Chronic cardiomegaly, aortic atherosclerosis and aortic tortuosity.

Increased interstitial lung markings which could reflect pneumonia
or early interstitial edema.

## 2023-06-11 ENCOUNTER — Other Ambulatory Visit: Payer: Self-pay | Admitting: Family

## 2023-06-11 DIAGNOSIS — I4819 Other persistent atrial fibrillation: Secondary | ICD-10-CM

## 2023-06-16 NOTE — Progress Notes (Signed)
 PCP: Corlis Honor BROCKS, MD (last seen 06/24) Primary Cardiologist: Florencio Kava, MD (last seen 08/24)  Chief Complaint: moderate fatigue  HPI:  Ann [name] is a 89 y/o female with a history of hyperlipidemia, HTN, thyroid disease, aortic stenosis, COPD, dementia and chronic heart failure.   Has not been admitted or been in the ED in the last 6 months.   Echo 10/13/21: EF of 65-70% along with mild LVH/ LAE, moderate MR and mild/moderate AR. Echo 12/20/21: EF of 60-65% along with mild/moderate LAE/RAE, moderate MR, mild/moderate AR and moderate/severe TR.    She presents today with his daughter for a HF follow-up visit with a chief complaint of moderate fatigue with minimal exertion. Has associated mild shortness of breath, occasional headaches, gradual weight gain and slight edema along with this. Denies chest pain, cough, palpitations, abdominal distention or dizziness. Is now drinking more water versus other liquids but daughter says that she still doesn't drink enough total. Occasionally the daughter feels like Ann [name]'s breathing gets more labored than at other times but then returns to baseline fairly quickly.    ROS: All systems negative except as listed in HPI, PMH and Problem List.  SH:  Social History   Socioeconomic History   Marital status: Married    Spouse name: Not on file   Number of children: Not on file   Years of education: Not on file   Highest education level: Not on file  Occupational History   Not on file  Tobacco Use   Smoking status: Never   Smokeless tobacco: Not on file  Substance and Sexual Activity   Alcohol use: Not on file   Drug use: Not on file   Sexual activity: Not on file  Other Topics Concern   Not on file  Social History Narrative   Not on file   Social Drivers of Health   Financial Resource Strain: Not on file  Food Insecurity: Not on file  Transportation Needs: Not on file  Physical Activity: Not on file  Stress: Not on file  Social  Connections: Not on file  Intimate Partner Violence: Not on file    FH:  Family History  Problem Relation Age of Onset   Arthritis Daughter     Past Medical History:  Diagnosis Date   Aortic stenosis    CHF (congestive heart failure) (HCC)    COPD (chronic obstructive pulmonary disease) (HCC)    Dementia (HCC)    High cholesterol    Hypertension    Thyroid disease     Current Outpatient Medications  Medication Sig Dispense Refill   acetaminophen  (TYLENOL ) 325 MG tablet Take 650 mg by mouth every 6 (six) hours as needed for headache. For pain     albuterol  (PROVENTIL  HFA;VENTOLIN  HFA) 108 (90 Base) MCG/ACT inhaler Inhale 2 puffs into the lungs every 6 (six) hours as needed for wheezing or shortness of breath. Dispense 1 spacer to use with the inhaler 1 Inhaler 2   apixaban  (ELIQUIS ) 5 MG TABS tablet Take 1 tablet (5 mg total) by mouth 2 (two) times daily. (Ann [name] taking differently: Take 2.5 mg by mouth 2 (two) times daily.) 60 tablet 5   BREZTRI AEROSPHERE 160-9-4.8 MCG/ACT AERO Inhale into the lungs.     diltiazem  (CARDIZEM  CD) 120 MG 24 hr capsule Take 1 capsule by mouth once daily 90 capsule 3   fluticasone  furoate-vilanterol (BREO ELLIPTA ) 100-25 MCG/ACT AEPB Inhale into the lungs.     furosemide  (LASIX ) 40 MG tablet Take 40  mg by mouth daily.     isosorbide  mononitrate (IMDUR ) 30 MG 24 hr tablet Take 30 mg by mouth daily.     levothyroxine  (SYNTHROID , LEVOTHROID) 50 MCG tablet Take 50 mcg by mouth daily.     loratadine (CLARITIN) 10 MG tablet Take by mouth.     Spacer/Aero-Holding Chambers (AEROCHAMBER PLUS FLO-VU MEDIUM) MISC 1 each by Other route once. For use with albuterol      No current facility-administered medications for this visit.   Vitals:   06/17/23 1316  BP: 103/70  Pulse: 60  SpO2: 98%  Weight: 168 lb (76.2 kg)   Wt Readings from Last 3 Encounters:  06/17/23 168 lb (76.2 kg)  01/13/23 159 lb (72.1 kg)  12/07/22 158 lb (71.7 kg)   Lab Results   Component Value Date   CREATININE 0.91 12/20/2021   CREATININE 0.88 12/19/2021   CREATININE 0.66 10/14/2021    PHYSICAL EXAM:  General:  Well appearing. No resp difficulty HEENT: normal Neck: supple. JVP flat. No lymphadenopathy or thryomegaly appreciated. Cor: PMI normal. Regular rate irregular rhythm. No rubs, gallops or murmurs. Lungs: clear Abdomen: soft, nontender, nondistended. No hepatosplenomegaly. No bruits or masses. Good bowel sounds. Extremities: no cyanosis, clubbing, rash, trace pitting edema bilateral lower legs Neuro: alert & oriented x3, cranial nerves grossly intact. Moves all 4 extremities w/o difficulty. Affect pleasant.   Assessment & Plan:  1: NICM with preserved ejection fraction- - suspect due to HTN/ AF - NYHA class III - euvolemic - weighing daily; reminded to call for an overnight weight gain of > 2 pounds or a weekly weight gain of >5 pounds - weight up 9 pounds since last visit here 6 months ago - Echo 10/13/21: EF of 65-70% along with mild LVH/ LAE, moderate MR and mild/moderate AR.  - Echo 12/20/21: EF of 60-65% along with mild/moderate LAE/RAE, moderate MR, mild/moderate AR and moderate/severe TR.  - not adding much salt to her food; encouraged her to not add any salt to her food - daughter is trying to be mindful of sodium content of foods - continue furosemide  40mg  daily; daughter can give additional 40mg  daily X 2 days for worsening SOB and then call our office for appt if symptoms continue - continue isosorbide  MN 30mg  daily - Ann [name], pharmacy, talked with Ann [name] and daughter about medications/ cost of meds - BNP 12/19/21 was 815.1  2: HTN- - BP 103/70 - saw PCP Ann [name]) 06/24; returns later this month - BMP 06/16/23 (PCP office) reviewed and showed sodium 141, potassium 3.4, creatinine 0.8 and GFR 100  3: COPD- - using albuterol  PRN  - continue breztri  4: Dementia- - lives with daughter - + short-term memory loss - palliative care visit  done 09/23  5: Atrial fibrillation- - saw cardiology Philippe) 08/24 - continue apixaban  2.5mg  BID - continue diltiazem  120mg  daily - HR (I)   Due to HF stability and difficulty in getting Ann [name], who is now 89, to appointments, will not make a return appointment at this time. Advised Ann [name] and daughter that they could call at anytime to make another appointment and they were comfortable with this. Otherwise, follow closely with PCP and cardiology.

## 2023-06-17 ENCOUNTER — Ambulatory Visit: Payer: Medicare HMO | Attending: Family | Admitting: Family

## 2023-06-17 ENCOUNTER — Encounter: Payer: Self-pay | Admitting: Family

## 2023-06-17 VITALS — BP 103/70 | HR 60 | Wt 168.0 lb

## 2023-06-17 DIAGNOSIS — F03A0 Unspecified dementia, mild, without behavioral disturbance, psychotic disturbance, mood disturbance, and anxiety: Secondary | ICD-10-CM

## 2023-06-17 DIAGNOSIS — I5032 Chronic diastolic (congestive) heart failure: Secondary | ICD-10-CM | POA: Diagnosis not present

## 2023-06-17 DIAGNOSIS — I4891 Unspecified atrial fibrillation: Secondary | ICD-10-CM | POA: Insufficient documentation

## 2023-06-17 DIAGNOSIS — I428 Other cardiomyopathies: Secondary | ICD-10-CM | POA: Diagnosis present

## 2023-06-17 DIAGNOSIS — I082 Rheumatic disorders of both aortic and tricuspid valves: Secondary | ICD-10-CM | POA: Diagnosis not present

## 2023-06-17 DIAGNOSIS — Z79899 Other long term (current) drug therapy: Secondary | ICD-10-CM | POA: Insufficient documentation

## 2023-06-17 DIAGNOSIS — I110 Hypertensive heart disease with heart failure: Secondary | ICD-10-CM | POA: Insufficient documentation

## 2023-06-17 DIAGNOSIS — I4819 Other persistent atrial fibrillation: Secondary | ICD-10-CM

## 2023-06-17 DIAGNOSIS — I10 Essential (primary) hypertension: Secondary | ICD-10-CM | POA: Diagnosis not present

## 2023-06-17 DIAGNOSIS — I509 Heart failure, unspecified: Secondary | ICD-10-CM | POA: Diagnosis not present

## 2023-06-17 DIAGNOSIS — Z7901 Long term (current) use of anticoagulants: Secondary | ICD-10-CM | POA: Insufficient documentation

## 2023-06-17 DIAGNOSIS — F0390 Unspecified dementia without behavioral disturbance: Secondary | ICD-10-CM | POA: Diagnosis not present

## 2023-06-17 DIAGNOSIS — R519 Headache, unspecified: Secondary | ICD-10-CM | POA: Diagnosis not present

## 2023-06-17 DIAGNOSIS — E079 Disorder of thyroid, unspecified: Secondary | ICD-10-CM | POA: Insufficient documentation

## 2023-06-17 DIAGNOSIS — J449 Chronic obstructive pulmonary disease, unspecified: Secondary | ICD-10-CM | POA: Diagnosis not present

## 2023-06-17 NOTE — Patient Instructions (Signed)
 Call us in the future if you need [place] for anything

## 2023-08-04 NOTE — Progress Notes (Addendum)
 Established Patient Visit   Chief Complaint: Chief Complaint  Patient presents with  . Follow-up    3 months   Date of Service: 08/02/2023 Date of Birth: 12-Apr-1920 PCP: Corlis Honor BROCKS, MD  History of Present Illness: Ms. Cando is a 89 y.o.female patient who presents for follow-up states to be doing reasonably well does not exercise much has some knee and wrist discomfort with little improvement with Tylenol  has some mild shortness of breath occasionally uses inhaler denies any chest pain denies any palpitations or tachycardia.  Patient states to be doing reasonably well in general is here with one of her daughters for follow-up mild lower extremity edema.  Patient complains of some dyspnea with exertion  Past Medical and Surgical History  Past Medical History Past Medical History:  Diagnosis Date  . Hyperlipidemia   . Hypertension     Past Surgical History She has a past surgical history that includes Appendectomy; Endoscopic Carpal Tunnel Release; Left total knee revision arthroplasty (04/21/2010); and Right total knee arthroplasty.   Medications and Allergies  Current Medications  Current Outpatient Medications  Medication Sig Dispense Refill  . acetaminophen  (TYLENOL ) 325 MG tablet Take 325 mg by mouth every 6 (six) hours as needed for Pain    . apixaban  (ELIQUIS ) 5 mg tablet Take 1 tablet by mouth twice daily 180 tablet 3  . BREZTRI AEROSPHERE 160-9-4.8 mcg/actuation inhaler Inhale 2 inhalations into the lungs 2 (two) times daily    . dilTIAZem  (DILT-XR) 120 MG XR capsule Take 1 capsule by mouth once daily    . fluticasone  furoate-vilanteroL (BREO ELLIPTA ) 100-25 mcg/dose DsDv inhaler Inhale 1 inhalation into the lungs once daily    . FUROsemide  (LASIX ) 40 MG tablet Take 1 tablet by mouth once daily 90 tablet 1  . isosorbide  mononitrate (IMDUR ) 30 MG ER tablet take 1 tablet every day 90 tablet 3  . levothyroxine  (SYNTHROID , LEVOTHROID) 50 MCG tablet Take 50 mcg by mouth once  daily. Take on an empty stomach with a glass of water at least 30-60 minutes before breakfast.    . loratadine (CLARITIN) 10 mg tablet Take 10 mg by mouth once daily     No current facility-administered medications for this visit.    Allergies: Penicillins  Social and Family History  Social History  reports that she has never smoked. She has never been exposed to tobacco smoke. She has never used smokeless tobacco. She reports that she does not drink alcohol and does not use drugs.  Family History Family History  Problem Relation Name Age of Onset  . No Known Problems Mother    . No Known Problems Father      Review of Systems   Review of Systems: The patient denies chest pain, shortness of breath, orthopnea, paroxysmal nocturnal dyspnea, pedal edema, palpitations, heart racing, presyncope, syncope. Review of 12 Systems is negative except as described above.  Physical Examination   Vitals:BP 104/76 (BP Location: Left upper arm, Patient Position: Sitting, BP Cuff Size: Adult)   Pulse 74   Resp 16   Ht 170.2 cm (5' 7)   Wt 74.4 kg (164 lb)   LMP  (LMP Unknown)   SpO2 96%   BMI 25.69 kg/m  Ht:170.2 cm (5' 7) Wt:74.4 kg (164 lb) ADJ:Anib surface area is 1.88 meters squared. Body mass index is 25.69 kg/m.  HEENT: Pupils equally reactive to light and accomodation  Neck: Supple without thyromegaly, carotid pulses 2+ Lungs: clear to auscultation bilaterally; no wheezes, rales, rhonchi  Heart: Regular rate and rhythm.  No gallops, murmurs or rub Abdomen: soft nontender, nondistended, with normal bowel sounds Extremities: no cyanosis, clubbing, or 1+edema Peripheral Pulses: 2+ in all extremities, 2+ femoral pulses bilaterally Neurologic: Alert and oriented X3; speech intact; face symmetrical; moves all extremities well  Assessment   89 y.o. female with  1. Chronic pain of both knees   2. Bilateral lower extremity edema   3. Rheumatoid arthritis, involving unspecified site,  unspecified whether rheumatoid factor present (CMS/HHS-HCC)   4. Paroxysmal A-fib (CMS/HHS-HCC)   5. Chronic diastolic CHF (congestive heart failure), NYHA class 3 (CMS/HHS-HCC)   6. Tricuspid valve insufficiency, unspecified etiology   7. Stage 3 chronic kidney disease, unspecified whether stage 3a or 3b CKD (CMS/HHS-HCC)   8. Chronic obstructive pulmonary disease, unspecified COPD type (CMS/HHS-HCC)   9. Mixed hyperlipidemia   10. Tachycardia   11. Asymmetric septal hypertrophy   12. Bilateral leg edema   13. Benign essential HTN        Plan  Atrial fibrillation paroxysmal currently on Eliquis  anticoagulation diltiazem  for rate recommend reducing Eliquis  to 2.5 mg a twice a day down from 5 mg because of her advanced age Lower extremity edema probably related to diltiazem  continue Lasix  therapy support stockings elevation Chronic diastolic dysfunction HFpEF currently on diltiazem  Lasix  Imdur  Moderate mitral regurgitation continue diuretics Chronic renal sufficiency stage III continue hydration have the patient follow-up with nephrology Hyperlipidemia continue diet and exercise consider adding statin therapy COPD continue inhalers consider follow-up with pulmonary Rheumatoid arthritis continue conservative therapy with Tylenol  as needed Have the patient follow-up 6 months     Return in about 3 months (around 10/30/2023).  DWAYNE D CALLWOOD, MD  This dictation was prepared with dragon dictation.  Any transcription errors that result from this process are unintentional.

## 2023-10-29 NOTE — Progress Notes (Signed)
 Established Patient Visit   Chief Complaint: Chief Complaint  Patient presents with  . Follow-up    3 months- No complaints   Date of Service: 10/26/2023 Date of Birth: November 15, 1919 PCP: Corlis Honor BROCKS, MD  History of Present Illness: Ann [name] is a 89 y.o.female patient who states to be doing reasonably well does not exercise months complaining of pain in her knees and trouble with arthritis denies any chest pain has some dyspnea on exertion but feels reasonably well sitting sleeping well and has a good appetite  Past Medical and Surgical History  Past Medical History Past Medical History:  Diagnosis Date  . Hyperlipidemia   . Hypertension     Past Surgical History She has a past surgical history that includes Appendectomy; Endoscopic Carpal Tunnel Release; Left total knee revision arthroplasty (04/21/2010); and Right total knee arthroplasty.   Medications and Allergies  Current Medications  Current Outpatient Medications  Medication Sig Dispense Refill  . acetaminophen  (TYLENOL ) 325 MG tablet Take 325 mg by mouth every 6 (six) hours as needed for Pain    . apixaban  (ELIQUIS ) 2.5 mg tablet Take 1 tablet (2.5 mg total) by mouth every 12 (twelve) hours 180 tablet 3  . BREZTRI AEROSPHERE 160-9-4.8 mcg/actuation inhaler Inhale 2 inhalations into the lungs 2 (two) times daily    . dilTIAZem  (DILT-XR) 120 MG XR capsule Take 1 capsule by mouth once daily    . fluticasone  furoate-vilanteroL (BREO ELLIPTA ) 100-25 mcg/dose DsDv inhaler Inhale 1 inhalation into the lungs once daily    . FUROsemide  (LASIX ) 40 MG tablet Take 1 tablet by mouth once daily 90 tablet 0  . isosorbide  mononitrate (IMDUR ) 30 MG ER tablet take 1 tablet every day 90 tablet 3  . levothyroxine  (SYNTHROID , LEVOTHROID) 50 MCG tablet Take 50 mcg by mouth once daily. Take on an empty stomach with a glass of water at least 30-60 minutes before breakfast.    . loratadine (CLARITIN) 10 mg tablet Take 10 mg by mouth once daily      No current facility-administered medications for this visit.    Allergies: Penicillins  Social and Family History  Social History  reports that she has never smoked. She has never been exposed to tobacco smoke. She has never used smokeless tobacco. She reports that she does not drink alcohol and does not use drugs.  Family History Family History  Problem Relation Name Age of Onset  . No Known Problems Mother    . No Known Problems Father      Review of Systems   Review of Systems: The patient denies chest pain, shortness of breath, orthopnea, paroxysmal nocturnal dyspnea, pedal edema, palpitations, heart racing, presyncope, syncope. Review of 12 Systems is negative except as described above.  Physical Examination   Vitals:BP 98/66 (BP Location: Left upper arm, Patient Position: Sitting, BP Cuff Size: Adult)   Pulse 66   Resp 14   Ht 170.2 cm (5' 7)   Wt 74.8 kg (165 lb)   LMP  (LMP Unknown)   SpO2 92%   BMI 25.84 kg/m  Ht:170.2 cm (5' 7) Wt:74.8 kg (165 lb) ADJ:Anib surface area is 1.88 meters squared. Body mass index is 25.84 kg/m.  HEENT: Pupils equally reactive to light and accomodation  Neck: Supple without thyromegaly, carotid pulses 2+ Lungs: clear to auscultation bilaterally; no wheezes, rales, rhonchi Heart: Regular rate and rhythm.  No gallops, murmurs or rub Abdomen: soft nontender, nondistended, with normal bowel sounds Extremities: no cyanosis, clubbing, or edema  Peripheral Pulses: 2+ in all extremities, 2+ femoral pulses bilaterally Neurologic: Alert and oriented X3; speech intact; face symmetrical; moves all extremities well  Assessment   89 y.o. female with  1. Chronic pain of both knees   2. Bilateral lower extremity edema   3. Rheumatoid arthritis, involving unspecified site, unspecified whether rheumatoid factor present (CMS/HHS-HCC)   4. Paroxysmal A-fib (CMS/HHS-HCC)   5. Chronic diastolic CHF (congestive heart failure), NYHA class 3  (CMS/HHS-HCC)   6. Tricuspid valve insufficiency, unspecified etiology   7. Stage 3 chronic kidney disease, unspecified whether stage 3a or 3b CKD (CMS/HHS-HCC)   8. Chronic obstructive pulmonary disease, unspecified COPD type (CMS/HHS-HCC)   9. Mixed hyperlipidemia   10. Tachycardia   11. Asymmetric septal hypertrophy   12. Bilateral leg edema        Plan  Paroxysmal atrial fibrillation Rheumatoid arthritis Chronic diastolic congestive heart failure Tricuspid insufficiency Chronic renal insufficiency COPD Hyperlipidemia Edema lower extremities      Return in about 3 months (around 01/26/2024).  DWAYNE D CALLWOOD, MD  This dictation was prepared with dragon dictation.  Any transcription errors that result from this process are unintentional.

## 2023-11-18 ENCOUNTER — Telehealth: Payer: Self-pay | Admitting: Internal Medicine

## 2023-11-18 NOTE — Telephone Encounter (Signed)
 Ann [name], I see her daughter and two granddaughters. See me before calling - about scheduling.

## 2023-11-18 NOTE — Telephone Encounter (Signed)
 Copied from CRM [id number]. Topic: Appointments - Scheduling Inquiry for Clinic >> Nov 18, 2023 10:09 AM Albertha Alosa wrote: Reason for CRM: Patient daughter Justice Olp called in wanting to see if Dr.Scott would take her mom as a new patient, would like for someone to give her a callback to schedule if able to schedule with her

## 2023-11-24 NOTE — Telephone Encounter (Unsigned)
 Copied from CRM [id number]. Topic: Appointments - Scheduling Inquiry for Clinic >> Nov 24, 2023  4:26 PM Chrystal Crape R wrote: Pt daughter Justice Olp calling on the status of Dr. Marilynne Shutter accepting her mom as a new pt.

## 2023-11-25 NOTE — Telephone Encounter (Signed)
 Noted

## 2023-11-25 NOTE — Telephone Encounter (Signed)
 Patient we talked about

## 2023-12-29 ENCOUNTER — Telehealth: Payer: Self-pay | Admitting: Family

## 2023-12-29 NOTE — Progress Notes (Unsigned)
 PCP: Corlis Honor BROCKS, MD (last seen 06/24) Primary Cardiologist: Florencio Kava, MD (last seen 08/24)  Chief Complaint: moderate fatigue  HPI:  Ann [name] is a 89 y/o female with a history of hyperlipidemia, HTN, thyroid disease, aortic stenosis, COPD, dementia and chronic heart failure.   Has not been admitted or been in the ED in the last 6 months.   Echo 10/13/21: EF of 65-70% along with mild LVH/ LAE, moderate MR and mild/moderate AR. Echo 12/20/21: EF of 60-65% along with mild/moderate LAE/RAE, moderate MR, mild/moderate AR and moderate/severe TR.    She presents today with his daughter for a HF follow-up visit with a chief complaint of moderate fatigue with minimal exertion. Has associated mild shortness of breath, occasional headaches, gradual weight gain and slight edema along with this. Denies chest pain, cough, palpitations, abdominal distention or dizziness. Is now drinking more water versus other liquids but daughter says that she still doesn't drink enough total. Occasionally the daughter feels like patient's breathing gets more labored than at other times but then returns to baseline fairly quickly.    ROS: All systems negative except as listed in HPI, PMH and Problem List.  SH:  Social History   Socioeconomic History   Marital status: Married    Spouse name: Not on file   Number of children: Not on file   Years of education: Not on file   Highest education level: Not on file  Occupational History   Not on file  Tobacco Use   Smoking status: Never   Smokeless tobacco: Not on file  Substance and Sexual Activity   Alcohol use: Not on file   Drug use: Not on file   Sexual activity: Not on file  Other Topics Concern   Not on file  Social History Narrative   Not on file   Social Drivers of Health   Financial Resource Strain: Not on file  Food Insecurity: Not on file  Transportation Needs: Not on file  Physical Activity: Not on file  Stress: Not on file  Social  Connections: Not on file  Intimate Partner Violence: Not on file    FH:  Family History  Problem Relation Age of Onset   Arthritis Daughter     Past Medical History:  Diagnosis Date   Aortic stenosis    CHF (congestive heart failure) (HCC)    COPD (chronic obstructive pulmonary disease) (HCC)    Dementia (HCC)    High cholesterol    Hypertension    Thyroid disease     Current Outpatient Medications  Medication Sig Dispense Refill   acetaminophen  (TYLENOL ) 325 MG tablet Take 650 mg by mouth every 6 (six) hours as needed for headache. For pain     albuterol  (PROVENTIL  HFA;VENTOLIN  HFA) 108 (90 Base) MCG/ACT inhaler Inhale 2 puffs into the lungs every 6 (six) hours as needed for wheezing or shortness of breath. Dispense 1 spacer to use with the inhaler 1 Inhaler 2   apixaban  (ELIQUIS ) 5 MG TABS tablet Take 1 tablet (5 mg total) by mouth 2 (two) times daily. (Patient taking differently: Take 2.5 mg by mouth 2 (two) times daily.) 60 tablet 5   BREZTRI AEROSPHERE 160-9-4.8 MCG/ACT AERO Inhale into the lungs.     diltiazem  (CARDIZEM  CD) 120 MG 24 hr capsule Take 1 capsule by mouth once daily 90 capsule 3   fluticasone  furoate-vilanterol (BREO ELLIPTA ) 100-25 MCG/ACT AEPB Inhale into the lungs.     furosemide  (LASIX ) 40 MG tablet Take 40  mg by mouth daily.     isosorbide  mononitrate (IMDUR ) 30 MG 24 hr tablet Take 30 mg by mouth daily.     levothyroxine  (SYNTHROID , LEVOTHROID) 50 MCG tablet Take 50 mcg by mouth daily.     loratadine (CLARITIN) 10 MG tablet Take by mouth.     Spacer/Aero-Holding Chambers (AEROCHAMBER PLUS FLO-VU MEDIUM) MISC 1 each by Other route once. For use with albuterol      No current facility-administered medications for this visit.   There were no vitals filed for this visit.  Wt Readings from Last 3 Encounters:  06/17/23 168 lb (76.2 kg)  01/13/23 159 lb (72.1 kg)  12/07/22 158 lb (71.7 kg)   Lab Results  Component Value Date   CREATININE 0.91 12/20/2021    CREATININE 0.88 12/19/2021   CREATININE 0.66 10/14/2021    PHYSICAL EXAM:  General:  Well appearing. No resp difficulty HEENT: normal Neck: supple. JVP flat. No lymphadenopathy or thryomegaly appreciated. Cor: PMI normal. Regular rate irregular rhythm. No rubs, gallops or murmurs. Lungs: clear Abdomen: soft, nontender, nondistended. No hepatosplenomegaly. No bruits or masses. Good bowel sounds. Extremities: no cyanosis, clubbing, rash, trace pitting edema bilateral lower legs Neuro: alert & oriented x3, cranial nerves grossly intact. Moves all 4 extremities w/o difficulty. Affect pleasant.   Assessment & Plan:  1: NICM with preserved ejection fraction- - suspect due to HTN/ AF - NYHA class III - euvolemic - weighing daily; reminded to call for an overnight weight gain of > 2 pounds or a weekly weight gain of >5 pounds - weight up 9 pounds since last visit here 6 months ago - Echo 10/13/21: EF of 65-70% along with mild LVH/ LAE, moderate MR and mild/moderate AR.  - Echo 12/20/21: EF of 60-65% along with mild/moderate LAE/RAE, moderate MR, mild/moderate AR and moderate/severe TR.  - not adding much salt to her food; encouraged her to not add any salt to her food - daughter is trying to be mindful of sodium content of foods - continue furosemide  40mg  daily; daughter can give additional 40mg  daily X 2 days for worsening SOB and then call our office for appt if symptoms continue - continue isosorbide  MN 30mg  daily - [facility], pharmacy, talked with patient and daughter about medications/ cost of meds - BNP 12/19/21 was 815.1  2: HTN- - BP 103/70 - saw PCP Isadora) 06/24; returns later this month - BMP 06/16/23 (PCP office) reviewed and showed sodium 141, potassium 3.4, creatinine 0.8 and GFR 100  3: COPD- - using albuterol  PRN  - continue breztri  4: Dementia- - lives with daughter - + short-term memory loss - palliative care visit done 09/23  5: Atrial fibrillation- - saw  cardiology Philippe) 08/24 - continue apixaban  2.5mg  BID - continue diltiazem  120mg  daily - HR (I)   Due to HF stability and difficulty in getting patient, who is now 89, to appointments, will not make a return appointment at this time. Advised patient and daughter that they could call at anytime to make another appointment and they were comfortable with this. Otherwise, follow closely with PCP and cardiology.

## 2023-12-29 NOTE — Telephone Encounter (Signed)
 Called to confirm/remind patient of their appointment at the [facility] on 12/30/23.   Appointment:   [x] Confirmed  [] Left mess   [] No answer/No voice mail  [] VM Full/unable to leave message  [] Phone not in service  Patient reminded to bring all medications and/or complete list.  Confirmed patient has transportation. Gave directions, instructed to utilize valet parking.

## 2023-12-30 ENCOUNTER — Ambulatory Visit: Payer: Self-pay | Admitting: Family

## 2023-12-30 ENCOUNTER — Ambulatory Visit (HOSPITAL_BASED_OUTPATIENT_CLINIC_OR_DEPARTMENT_OTHER): Admitting: Family

## 2023-12-30 ENCOUNTER — Other Ambulatory Visit
Admission: RE | Admit: 2023-12-30 | Discharge: 2023-12-30 | Disposition: A | Discharge status: Home / Self Care | Source: Ambulatory Visit | Attending: Family | Admitting: Family

## 2023-12-30 VITALS — BP 112/70 | HR 62 | Wt 160.0 lb

## 2023-12-30 DIAGNOSIS — F03A0 Unspecified dementia, mild, without behavioral disturbance, psychotic disturbance, mood disturbance, and anxiety: Secondary | ICD-10-CM

## 2023-12-30 DIAGNOSIS — I10 Essential (primary) hypertension: Secondary | ICD-10-CM | POA: Diagnosis not present

## 2023-12-30 DIAGNOSIS — F0390 Unspecified dementia without behavioral disturbance: Secondary | ICD-10-CM | POA: Diagnosis not present

## 2023-12-30 DIAGNOSIS — Z7901 Long term (current) use of anticoagulants: Secondary | ICD-10-CM | POA: Insufficient documentation

## 2023-12-30 DIAGNOSIS — I361 Nonrheumatic tricuspid (valve) insufficiency: Secondary | ICD-10-CM | POA: Diagnosis not present

## 2023-12-30 DIAGNOSIS — J449 Chronic obstructive pulmonary disease, unspecified: Secondary | ICD-10-CM | POA: Diagnosis not present

## 2023-12-30 DIAGNOSIS — I340 Nonrheumatic mitral (valve) insufficiency: Secondary | ICD-10-CM | POA: Diagnosis not present

## 2023-12-30 DIAGNOSIS — I5032 Chronic diastolic (congestive) heart failure: Secondary | ICD-10-CM | POA: Diagnosis present

## 2023-12-30 DIAGNOSIS — I4821 Permanent atrial fibrillation: Secondary | ICD-10-CM

## 2023-12-30 DIAGNOSIS — I110 Hypertensive heart disease with heart failure: Secondary | ICD-10-CM | POA: Insufficient documentation

## 2023-12-30 DIAGNOSIS — R5383 Other fatigue: Secondary | ICD-10-CM | POA: Insufficient documentation

## 2023-12-30 DIAGNOSIS — I428 Other cardiomyopathies: Secondary | ICD-10-CM | POA: Insufficient documentation

## 2023-12-30 DIAGNOSIS — I4891 Unspecified atrial fibrillation: Secondary | ICD-10-CM | POA: Diagnosis not present

## 2023-12-30 DIAGNOSIS — I351 Nonrheumatic aortic (valve) insufficiency: Secondary | ICD-10-CM | POA: Diagnosis not present

## 2023-12-30 LAB — BASIC METABOLIC PANEL WITH GFR
Anion gap: 10 (ref 5–15)
BUN: 22 mg/dL (ref 8–23)
CO2: 29 mmol/L (ref 22–32)
Calcium: 9.1 mg/dL (ref 8.9–10.3)
Chloride: 100 mmol/L (ref 98–111)
Creatinine, Ser: 1.13 mg/dL — ABNORMAL HIGH (ref 0.44–1.00)
GFR, Estimated: 43 mL/min — ABNORMAL LOW (ref >60)
Glucose, Bld: 106 mg/dL — ABNORMAL HIGH (ref 70–99)
Potassium: 4.2 mmol/L (ref 3.5–5.1)
Sodium: 139 mmol/L (ref 135–145)

## 2023-12-30 LAB — CBC
HCT: 38.1 % (ref 36.0–46.0)
Hemoglobin: 12.4 g/dL (ref 12.0–15.0)
MCH: 31.8 pg (ref 26.0–34.0)
MCHC: 32.5 g/dL (ref 30.0–36.0)
MCV: 97.7 fL (ref 80.0–100.0)
Platelets: 233 K/uL (ref 150–400)
RBC: 3.9 MIL/uL (ref 3.87–5.11)
RDW: 13.9 % (ref 11.5–15.5)
WBC: 4 K/uL (ref 4.0–10.5)
nRBC: 0 % (ref 0.0–0.2)

## 2023-12-30 LAB — BRAIN NATRIURETIC PEPTIDE: B Natriuretic Peptide: 409.2 pg/mL — ABNORMAL HIGH (ref 0.0–100.0)

## 2023-12-30 NOTE — Patient Instructions (Signed)
 Medication Changes:  No medication changes today!  Lab Work:  Go over to the [facility]. Go pass the gift shop and have your blood work completed.  We will only call you if the results are abnormal or if the provider would like to make medication changes.   If you are having labs drawn today, any labs that are abnormal the clinic will call you. No news is good news.    Follow-Up in: Please follow up with the [facility] as needed. No need to make an appointment today! Just give us  a call if you would like to be seen.   Thank you for choosing [facility].    At the [facility], you and your health needs are our priority. We have a designated team specialized in the treatment of Heart Failure. This Care Team includes your primary Heart Failure Specialized Cardiologist (physician), Advanced Practice Providers (APPs- Physician Assistants and Nurse Practitioners), and Pharmacist who all work together to provide you with the care you need, when you need it.   You may see any of the following providers on your designated Care Team at your next follow up:  Dr. Toribio Fuel Dr. Ezra Shuck Dr. Ria Commander Dr. Morene Brownie Ellouise Class, FNP Jaun Bash, RPH-CPP  Please be sure to bring in all your medications bottles to every appointment.   Need to Contact Us :  If you have any questions or concerns before your next appointment please send us  a message through [facility] or call our office at [phone].    TO LEAVE A MESSAGE FOR THE NURSE SELECT OPTION 2, PLEASE LEAVE A MESSAGE INCLUDING: YOUR NAME DATE OF BIRTH CALL BACK NUMBER REASON FOR CALL**this is important as we prioritize the call backs  YOU WILL RECEIVE A CALL BACK THE SAME DAY AS LONG AS YOU CALL BEFORE 4:00 PM

## 2023-12-31 ENCOUNTER — Encounter: Payer: Self-pay | Admitting: Family

## 2024-01-31 ENCOUNTER — Ambulatory Visit (INDEPENDENT_AMBULATORY_CARE_PROVIDER_SITE_OTHER): Admitting: Internal Medicine

## 2024-01-31 VITALS — BP 112/72 | HR 76 | Resp 16 | Ht 67.0 in | Wt 158.0 lb

## 2024-01-31 DIAGNOSIS — R300 Dysuria: Secondary | ICD-10-CM | POA: Diagnosis not present

## 2024-01-31 DIAGNOSIS — J449 Chronic obstructive pulmonary disease, unspecified: Secondary | ICD-10-CM

## 2024-01-31 DIAGNOSIS — I10 Essential (primary) hypertension: Secondary | ICD-10-CM | POA: Diagnosis not present

## 2024-01-31 DIAGNOSIS — N1831 Chronic kidney disease, stage 3a: Secondary | ICD-10-CM

## 2024-01-31 DIAGNOSIS — I4891 Unspecified atrial fibrillation: Secondary | ICD-10-CM

## 2024-01-31 NOTE — Progress Notes (Signed)
 Subjective:    Patient ID: Ann [name], female    DOB: [birth year], 89 y.o.   MRN: [id number]  Patient here for  Chief Complaint  Patient presents with   New Patient (Initial Visit)    HPI Here to establish care. Has a history of hypertension, aortic stenosis, COPD, hypercholesterolemia, thyroid disease and chronic heart failure. Seen by Ellouise Class 12/30/23 - fatigue, sob and pedal edema. Weighing daily. Continue lasix  and imdur . States her stable weight at home ranges between 160-163. She is accompanied by her daughter (she lives with). History obtained from both of them. Overall breathing is relatively stable. No chest pain reported. Trying to avoid increased sodium intake. Continues breztri. Has albuterol  if needed. No abdominal pain reported. Takes miralax  and drinks juice - to keep bowels moving.    Past Medical History:  Diagnosis Date   Aortic stenosis    CHF (congestive heart failure) (HCC)    COPD (chronic obstructive pulmonary disease) (HCC)    Dementia (HCC)    High cholesterol    Hypertension    Thyroid disease    Past Surgical History:  Procedure Laterality Date   APPENDECTOMY     JOINT REPLACEMENT     THYROID SURGERY     Family History  Problem Relation Age of Onset   Arthritis Daughter    Social History   Socioeconomic History   Marital status: Married    Spouse name: Not on file   Number of children: Not on file   Years of education: Not on file   Highest education level: Not on file  Occupational History   Not on file  Tobacco Use   Smoking status: Never   Smokeless tobacco: Not on file  Substance and Sexual Activity   Alcohol use: Not on file   Drug use: Not on file   Sexual activity: Not on file  Other Topics Concern   Not on file  Social History Narrative   Not on file   Social Drivers of Health   Financial Resource Strain: Not on file  Food Insecurity: Not on file  Transportation Needs: Not on file  Physical Activity: Not on file   Stress: Not on file  Social Connections: Not on file     Review of Systems  Constitutional:  Negative for appetite change and unexpected weight change.  HENT:  Negative for congestion and sinus pressure.   Respiratory:  Negative for cough and chest tightness.        Breathing overall stable.   Cardiovascular:  Negative for chest pain and palpitations.       No increased swelling.   Gastrointestinal:  Negative for abdominal pain, nausea and vomiting.  Genitourinary:  Negative for difficulty urinating and dysuria.  Musculoskeletal:  Negative for joint swelling and myalgias.  Skin:  Negative for color change and rash.  Neurological:  Negative for dizziness and headaches.  Psychiatric/Behavioral:  Negative for agitation and dysphoric mood.        Objective:     BP 112/72   Pulse 76   Resp 16   Ht 5' 7 (1.702 m)   Wt 158 lb (71.7 kg)   SpO2 98%   BMI 24.75 kg/m  Wt Readings from Last 3 Encounters:  01/31/24 158 lb (71.7 kg)  12/30/23 160 lb (72.6 kg)  06/17/23 168 lb (76.2 kg)    Physical Exam Vitals reviewed.  Constitutional:      General: She is not in acute distress.    Appearance:  Normal appearance.  HENT:     Head: Normocephalic and atraumatic.     Right Ear: External ear normal.     Left Ear: External ear normal.     Mouth/Throat:     Pharynx: No oropharyngeal exudate or posterior oropharyngeal erythema.  Eyes:     General: No scleral icterus.       Right eye: No discharge.        Left eye: No discharge.     Conjunctiva/sclera: Conjunctivae normal.  Neck:     Thyroid: No thyromegaly.  Cardiovascular:     Rate and Rhythm: Normal rate and regular rhythm.  Pulmonary:     Effort: No respiratory distress.     Breath sounds: Normal breath sounds. No wheezing.  Abdominal:     General: Bowel sounds are normal.     Palpations: Abdomen is soft.     Tenderness: There is no abdominal tenderness.  Musculoskeletal:        General: No tenderness.     Cervical  back: Neck supple. No tenderness.     Comments: No increased swelling.   Lymphadenopathy:     Cervical: No cervical adenopathy.  Skin:    Findings: No erythema or rash.  Neurological:     Mental Status: She is alert.  Psychiatric:        Mood and Affect: Mood normal.        Behavior: Behavior normal.         Outpatient Encounter Medications as of 01/31/2024  Medication Sig   acetaminophen  (TYLENOL ) 325 MG tablet Take 650 mg by mouth every 6 (six) hours as needed for headache. For pain   albuterol  (PROVENTIL  HFA;VENTOLIN  HFA) 108 (90 Base) MCG/ACT inhaler Inhale 2 puffs into the lungs every 6 (six) hours as needed for wheezing or shortness of breath. Dispense 1 spacer to use with the inhaler   apixaban  (ELIQUIS ) 5 MG TABS tablet Take 1 tablet (5 mg total) by mouth 2 (two) times daily. (Patient taking differently: Take 2.5 mg by mouth 2 (two) times daily.)   BREZTRI AEROSPHERE 160-9-4.8 MCG/ACT AERO Inhale into the lungs.   diltiazem  (CARDIZEM  CD) 120 MG 24 hr capsule Take 1 capsule by mouth once daily   fluticasone  furoate-vilanterol (BREO ELLIPTA ) 100-25 MCG/ACT AEPB Inhale into the lungs.   furosemide  (LASIX ) 40 MG tablet Take 40 mg by mouth daily.   isosorbide  mononitrate (IMDUR ) 30 MG 24 hr tablet Take 30 mg by mouth daily.   levothyroxine  (SYNTHROID , LEVOTHROID) 50 MCG tablet Take 50 mcg by mouth daily.   loratadine (CLARITIN) 10 MG tablet Take by mouth.   Spacer/Aero-Holding Chambers (AEROCHAMBER PLUS FLO-VU MEDIUM) MISC 1 each by Other route once. For use with albuterol    No facility-administered encounter medications on file as of 01/31/2024.     Lab Results  Component Value Date   WBC 4.0 12/30/2023   HGB 12.4 12/30/2023   HCT 38.1 12/30/2023   PLT 233 12/30/2023   GLUCOSE 106 (H) 12/30/2023   CHOL 177 08/25/2011   TRIG 31 08/25/2011   HDL 88 (H) 08/25/2011   LDLCALC 83 08/25/2011   ALT 16 10/14/2021   AST 34 10/14/2021   NA 139 12/30/2023   K 4.2 12/30/2023    CL 100 12/30/2023   CREATININE 1.13 (H) 12/30/2023   BUN 22 12/30/2023   CO2 29 12/30/2023   TSH 1.522 12/19/2021   INR 1.0 08/10/2013       Assessment & Plan:  Dysuria -  Urinalysis, Routine w reflex microscopic -     Urine Culture  Atrial fibrillation, unspecified type [facility]) Assessment & Plan: Rate controlled. Continue on eliquis  - on 2.5mg  bid. Follow.    Benign essential hypertension Assessment & Plan: Continues on imdur  and lasix . Blood pressure as outlined. No changes in medication today. Follow.    Stage 3a chronic kidney disease (HCC) Assessment & Plan: Avoid antiinflammatory medications. Follow metabolic panel.    Chronic obstructive pulmonary disease, unspecified COPD type (HCC) Assessment & Plan: Continue breztri. Breathing stable. Has albuterol  if needed. Follow.       Allena Hamilton, MD

## 2024-02-01 ENCOUNTER — Ambulatory Visit: Payer: Self-pay | Admitting: Internal Medicine

## 2024-02-01 LAB — URINE CULTURE
MICRO NUMBER:: 16877916
SPECIMEN QUALITY:: ADEQUATE

## 2024-02-01 LAB — URINALYSIS, ROUTINE W REFLEX MICROSCOPIC
Bilirubin Urine: NEGATIVE
Hgb urine dipstick: NEGATIVE
Ketones, ur: NEGATIVE
Nitrite: NEGATIVE
Specific Gravity, Urine: 1.01 (ref 1.000–1.030)
Total Protein, Urine: NEGATIVE
Urine Glucose: NEGATIVE
Urobilinogen, UA: 0.2 (ref 0.0–1.0)
pH: 7.5 (ref 5.0–8.0)

## 2024-02-06 ENCOUNTER — Encounter: Payer: Self-pay | Admitting: Internal Medicine

## 2024-02-06 DIAGNOSIS — N183 Chronic kidney disease, stage 3 unspecified: Secondary | ICD-10-CM | POA: Insufficient documentation

## 2024-02-06 DIAGNOSIS — J449 Chronic obstructive pulmonary disease, unspecified: Secondary | ICD-10-CM | POA: Insufficient documentation

## 2024-02-06 NOTE — Assessment & Plan Note (Signed)
 Continue breztri. Breathing stable. Has albuterol  if needed. Follow.

## 2024-02-06 NOTE — Assessment & Plan Note (Signed)
 Avoid antiinflammatory medications. Follow metabolic panel.

## 2024-02-06 NOTE — Assessment & Plan Note (Signed)
 Continues on imdur  and lasix . Blood pressure as outlined. No changes in medication today. Follow.

## 2024-02-06 NOTE — Assessment & Plan Note (Signed)
 Rate controlled. Continue on eliquis  - on 2.5mg  bid. Follow.

## 2024-04-25 ENCOUNTER — Other Ambulatory Visit: Payer: Self-pay | Admitting: Internal Medicine

## 2024-04-25 NOTE — Telephone Encounter (Unsigned)
 Copied from CRM #[id number]. Topic: Clinical - Medication Refill >> Apr 25, 2024  8:21 AM Avram MATSU wrote: Medication: levothyroxine  (SYNTHROID , LEVOTHROID) 50 MCG tablet [[id number]]  Has the patient contacted their pharmacy? Yes (Agent: If no, request that the patient contact the pharmacy for the refill. If patient does not wish to contact the pharmacy document the reason why and proceed with request.) (Agent: If yes, when and what did the pharmacy advise?)  This is the patient's preferred pharmacy:  [facility] - [place], [place] - [address] [place] [address] Phone: [phone] Fax: [phone]   Is this the correct pharmacy for this prescription? Yes If no, delete pharmacy and type the correct one.   Has the prescription been filled recently? NA  Is the patient out of the medication? No  Has the patient been seen for an appointment in the last year OR does the patient have an upcoming appointment? Yes  Can we respond through MyChart? No  Agent: Please be advised that Rx refills may take up to 3 business days. We ask that you follow-up with your pharmacy.

## 2024-04-27 MED ORDER — LEVOTHYROXINE SODIUM 50 MCG PO TABS: 50.0000 ug | ORAL_TABLET | Freq: Every day | ORAL | 0 refills | Status: DC

## 2024-04-27 NOTE — Telephone Encounter (Signed)
 New pt to me. Rx ok'd for synthroid . Needs to keep upcoming appt. Will plan for labs at appt.

## 2024-05-02 ENCOUNTER — Ambulatory Visit (INDEPENDENT_AMBULATORY_CARE_PROVIDER_SITE_OTHER): Admitting: Internal Medicine

## 2024-05-02 VITALS — BP 116/70 | HR 61 | Temp 97.9°F | Ht 67.0 in

## 2024-05-02 DIAGNOSIS — I10 Essential (primary) hypertension: Secondary | ICD-10-CM

## 2024-05-02 DIAGNOSIS — N1831 Chronic kidney disease, stage 3a: Secondary | ICD-10-CM

## 2024-05-02 DIAGNOSIS — E039 Hypothyroidism, unspecified: Secondary | ICD-10-CM | POA: Diagnosis not present

## 2024-05-02 DIAGNOSIS — J449 Chronic obstructive pulmonary disease, unspecified: Secondary | ICD-10-CM | POA: Diagnosis not present

## 2024-05-02 DIAGNOSIS — E785 Hyperlipidemia, unspecified: Secondary | ICD-10-CM | POA: Diagnosis not present

## 2024-05-02 DIAGNOSIS — I110 Hypertensive heart disease with heart failure: Secondary | ICD-10-CM

## 2024-05-02 DIAGNOSIS — I4891 Unspecified atrial fibrillation: Secondary | ICD-10-CM

## 2024-05-02 NOTE — Progress Notes (Signed)
 Subjective:    Patient ID: Ann [name], female    DOB: [birth year], 89 y.o.   MRN: [id number]  Patient here for  Chief Complaint  Patient presents with   Medical Management of Chronic Issues    3 mth f/u    HPI Here for a scheduled follow up - follow up regarding hypertension, aortic stenosis, COPD, hypercholesterolemia, thyroid disease and chronic heart failure. Seen by [facility] 12/30/23 - fatigue, sob and pedal edema. Weighing daily. Continue lasix  and imdur . States her stable weight at home ranges between 160-163. She is accompanied by her daughter (she lives with). History obtained from both of them. Continues on eliquis  - afib. Continues brestri. Has albuterol  if needed. Overall feels breathing is stable. She is eating. No increased GI issues reported. Bowels moving. Discussed weighing regularly.    Past Medical History:  Diagnosis Date   Aortic stenosis    CHF (congestive heart failure) (HCC)    COPD (chronic obstructive pulmonary disease) (HCC)    Dementia (HCC)    High cholesterol    Hypertension    Thyroid disease    Past Surgical History:  Procedure Laterality Date   APPENDECTOMY     JOINT REPLACEMENT     THYROID SURGERY     Family History  Problem Relation Age of Onset   Arthritis Daughter    Social History   Socioeconomic History   Marital status: Married    Spouse name: Not on file   Number of children: Not on file   Years of education: Not on file   Highest education level: Not on file  Occupational History   Not on file  Tobacco Use   Smoking status: Never   Smokeless tobacco: Former  [employer]   Vaping status: Never Used  Substance and Sexual Activity   Alcohol use: Never   Drug use: Never   Sexual activity: Not on file  Other Topics Concern   Not on file  Social History Narrative   Not on file   Social Drivers of Health   Financial Resource Strain: Not on file  Food Insecurity: Not on file  Transportation Needs: Not on file   Physical Activity: Not on file  Stress: Not on file  Social Connections: Not on file     Review of Systems  Constitutional:  Negative for appetite change.       Weight stable from July. Decreased from 06/2023.   HENT:  Negative for congestion and sinus pressure.   Respiratory:  Negative for cough and chest tightness.        Breathing stable.   Cardiovascular:  Negative for chest pain and palpitations.  Gastrointestinal:  Negative for vomiting.       No increased abdominal pain reported. Bowels moving.   Genitourinary:  Negative for difficulty urinating and dysuria.  Musculoskeletal:  Negative for joint swelling and myalgias.  Skin:  Negative for color change and rash.  Neurological:  Negative for dizziness and headaches.  Psychiatric/Behavioral:  Negative for agitation and dysphoric mood.        Objective:     BP 116/70   Pulse 61   Temp 97.9 F (36.6 C) (Oral)   Ht 5' 7 (1.702 m)   SpO2 97%   BMI 24.75 kg/m  Wt Readings from Last 3 Encounters:  05/03/24 150 lb (68 kg)  01/31/24 158 lb (71.7 kg)  12/30/23 160 lb (72.6 kg)    Physical Exam Vitals reviewed.  Constitutional:  General: She is not in acute distress.    Appearance: Normal appearance.  HENT:     Head: Normocephalic and atraumatic.     Right Ear: External ear normal.     Left Ear: External ear normal.     Mouth/Throat:     Pharynx: No oropharyngeal exudate or posterior oropharyngeal erythema.  Eyes:     General: No scleral icterus.       Right eye: No discharge.        Left eye: No discharge.     Conjunctiva/sclera: Conjunctivae normal.  Neck:     Thyroid: No thyromegaly.  Cardiovascular:     Rate and Rhythm: Normal rate and regular rhythm.  Pulmonary:     Effort: No respiratory distress.     Breath sounds: Normal breath sounds. No wheezing.  Abdominal:     General: Bowel sounds are normal.     Palpations: Abdomen is soft.     Comments: No significant pain to palpation.    Musculoskeletal:        General: No swelling or tenderness.     Cervical back: Neck supple. No tenderness.  Lymphadenopathy:     Cervical: No cervical adenopathy.  Skin:    Findings: No erythema or rash.  Neurological:     Mental Status: She is alert.  Psychiatric:        Mood and Affect: Mood normal.        Behavior: Behavior normal.         Outpatient Encounter Medications as of 05/02/2024  Medication Sig   acetaminophen  (TYLENOL ) 325 MG tablet Take 650 mg by mouth every 6 (six) hours as needed for headache. For pain   apixaban  (ELIQUIS ) 5 MG TABS tablet Take 1 tablet (5 mg total) by mouth 2 (two) times daily. (Patient taking differently: Take 2.5 mg by mouth 2 (two) times daily.)   BREZTRI AEROSPHERE 160-9-4.8 MCG/ACT AERO Inhale into the lungs.   diltiazem  (CARDIZEM  CD) 120 MG 24 hr capsule Take 1 capsule by mouth once daily   fluticasone  furoate-vilanterol (BREO ELLIPTA ) 100-25 MCG/ACT AEPB Inhale into the lungs.   furosemide  (LASIX ) 40 MG tablet Take 40 mg by mouth daily.   isosorbide  mononitrate (IMDUR ) 30 MG 24 hr tablet Take 30 mg by mouth daily.   levothyroxine  (SYNTHROID ) 50 MCG tablet Take 1 tablet (50 mcg total) by mouth daily.   loratadine (CLARITIN) 10 MG tablet Take by mouth.   Spacer/Aero-Holding Chambers (AEROCHAMBER PLUS FLO-VU MEDIUM) MISC 1 each by Other route once. For use with albuterol    [DISCONTINUED] albuterol  (PROVENTIL  HFA;VENTOLIN  HFA) 108 (90 Base) MCG/ACT inhaler Inhale 2 puffs into the lungs every 6 (six) hours as needed for wheezing or shortness of breath. Dispense 1 spacer to use with the inhaler (Patient not taking: Reported on 05/02/2024)   [DISCONTINUED] levothyroxine  (SYNTHROID , LEVOTHROID) 50 MCG tablet Take 50 mcg by mouth daily.   No facility-administered encounter medications on file as of 05/02/2024.     Lab Results  Component Value Date   WBC 7.7 05/03/2024   HGB 13.7 05/03/2024   HCT 41.3 05/03/2024   PLT 225 05/03/2024    GLUCOSE 143 (H) 05/03/2024   CHOL 177 08/25/2011   TRIG 31 08/25/2011   HDL 88 (H) 08/25/2011   LDLCALC 83 08/25/2011   ALT 5 05/03/2024   AST 19 05/03/2024   NA 137 05/03/2024   K 4.1 05/03/2024   CL 101 05/03/2024   CREATININE 0.87 05/03/2024   BUN 25 (H) 05/03/2024  CO2 26 05/03/2024   TSH 2.46 05/02/2024   INR 1.0 08/10/2013       Assessment & Plan:  Hyperlipidemia, unspecified hyperlipidemia type Assessment & Plan: Check lipid panel.   Orders: -     CBC with Differential/Platelet -     Basic metabolic panel with GFR -     Hepatic function panel  Hypothyroidism, unspecified type Assessment & Plan: Continue thyroid replacement. Follow tsh.   Orders: -     TSH  Hyperbilirubinemia Assessment & Plan: Has had a history of elevated bilirubin. Remainder of liver panel wnl. No increased GI symptoms today. Recheck liver panel.    Chronic obstructive pulmonary disease, unspecified COPD type (HCC) Assessment & Plan: Continue breztri. Breathing stable. No needing albuterol .    Stage 3a chronic kidney disease (HCC) Assessment & Plan: Avoid antiinflammatory medication. Check metabolic panel today.    Benign essential hypertension Assessment & Plan: Continues on cardizem ,imdur  and lasix . Blood pressure as outlined. No changes in medication today. Follow pressures. Check metabolic panel.    Atrial fibrillation, unspecified type [facility]) Assessment & Plan: Rate controlled. Continue eliquis . Stable.    Hypertensive heart disease with heart failure (HCC) Assessment & Plan: Continue imdur , lasix . Following weights. No evidence of volume overload on exam today. Follow. Check metabolic panel.       Allena Hamilton, MD

## 2024-05-03 ENCOUNTER — Emergency Department: Admission: EM | Admit: 2024-05-03 | Discharge: 2024-05-03 | Disposition: A | Discharge status: Home / Self Care

## 2024-05-03 ENCOUNTER — Ambulatory Visit: Admitting: Internal Medicine

## 2024-05-03 ENCOUNTER — Encounter: Payer: Self-pay | Admitting: Intensive Care

## 2024-05-03 ENCOUNTER — Emergency Department

## 2024-05-03 ENCOUNTER — Other Ambulatory Visit: Payer: Self-pay

## 2024-05-03 DIAGNOSIS — K819 Cholecystitis, unspecified: Secondary | ICD-10-CM | POA: Diagnosis not present

## 2024-05-03 DIAGNOSIS — R109 Unspecified abdominal pain: Secondary | ICD-10-CM | POA: Diagnosis present

## 2024-05-03 DIAGNOSIS — I4891 Unspecified atrial fibrillation: Secondary | ICD-10-CM | POA: Insufficient documentation

## 2024-05-03 DIAGNOSIS — Z7901 Long term (current) use of anticoagulants: Secondary | ICD-10-CM | POA: Diagnosis not present

## 2024-05-03 LAB — COMPREHENSIVE METABOLIC PANEL WITH GFR
ALT: 5 U/L (ref 0–44)
AST: 19 U/L (ref 15–41)
Albumin: 4.3 g/dL (ref 3.5–5.0)
Alkaline Phosphatase: 83 U/L (ref 38–126)
Anion gap: 10 (ref 5–15)
BUN: 25 mg/dL — ABNORMAL HIGH (ref 8–23)
CO2: 26 mmol/L (ref 22–32)
Calcium: 9.2 mg/dL (ref 8.9–10.3)
Chloride: 101 mmol/L (ref 98–111)
Creatinine, Ser: 0.87 mg/dL (ref 0.44–1.00)
GFR, Estimated: 59 mL/min — ABNORMAL LOW (ref >60)
Glucose, Bld: 143 mg/dL — ABNORMAL HIGH (ref 70–99)
Potassium: 4.1 mmol/L (ref 3.5–5.1)
Sodium: 137 mmol/L (ref 135–145)
Total Bilirubin: 1.9 mg/dL — ABNORMAL HIGH (ref 0.0–1.2)
Total Protein: 7.5 g/dL (ref 6.5–8.1)

## 2024-05-03 LAB — CBC
HCT: 41.3 % (ref 36.0–46.0)
Hemoglobin: 13.7 g/dL (ref 12.0–15.0)
MCH: 33.1 pg (ref 26.0–34.0)
MCHC: 33.2 g/dL (ref 30.0–36.0)
MCV: 99.8 fL (ref 80.0–100.0)
Platelets: 225 K/uL (ref 150–400)
RBC: 4.14 MIL/uL (ref 3.87–5.11)
RDW: 14.2 % (ref 11.5–15.5)
WBC: 7.7 K/uL (ref 4.0–10.5)
nRBC: 0 % (ref 0.0–0.2)

## 2024-05-03 LAB — CBC WITH DIFFERENTIAL/PLATELET
Basophils Absolute: 0 K/uL (ref 0.0–0.1)
Basophils Relative: 0.6 % (ref 0.0–3.0)
Eosinophils Absolute: 0 K/uL (ref 0.0–0.7)
Eosinophils Relative: 0.4 % (ref 0.0–5.0)
HCT: 39.8 % (ref 36.0–46.0)
Hemoglobin: 13.4 g/dL (ref 12.0–15.0)
Lymphocytes Relative: 17.2 % (ref 12.0–46.0)
Lymphs Abs: 0.9 K/uL (ref 0.7–4.0)
MCHC: 33.6 g/dL (ref 30.0–36.0)
MCV: 98.5 fl (ref 78.0–100.0)
Monocytes Absolute: 0.5 K/uL (ref 0.1–1.0)
Monocytes Relative: 9.7 % (ref 3.0–12.0)
Neutro Abs: 3.8 K/uL (ref 1.4–7.7)
Neutrophils Relative %: 72.1 % (ref 43.0–77.0)
Platelets: 252 K/uL (ref 150.0–400.0)
RBC: 4.05 Mil/uL (ref 3.87–5.11)
RDW: 14.7 % (ref 11.5–15.5)
WBC: 5.2 K/uL (ref 4.0–10.5)

## 2024-05-03 LAB — BASIC METABOLIC PANEL WITH GFR
BUN: 33 mg/dL — ABNORMAL HIGH (ref 6–23)
CO2: 30 meq/L (ref 19–32)
Calcium: 9.3 mg/dL (ref 8.4–10.5)
Chloride: 101 meq/L (ref 96–112)
Creatinine, Ser: 1.23 mg/dL — ABNORMAL HIGH (ref 0.40–1.20)
GFR: 35.69 mL/min — ABNORMAL LOW (ref >60.00)
Glucose, Bld: 100 mg/dL — ABNORMAL HIGH (ref 70–99)
Potassium: 4.3 meq/L (ref 3.5–5.1)
Sodium: 140 meq/L (ref 135–145)

## 2024-05-03 LAB — HEPATIC FUNCTION PANEL
ALT: 8 U/L (ref 0–35)
AST: 16 U/L (ref 0–37)
Albumin: 4.2 g/dL (ref 3.5–5.2)
Alkaline Phosphatase: 70 U/L (ref 39–117)
Bilirubin, Direct: 0.3 mg/dL (ref 0.0–0.3)
Total Bilirubin: 1.8 mg/dL — ABNORMAL HIGH (ref 0.2–1.2)
Total Protein: 7.5 g/dL (ref 6.0–8.3)

## 2024-05-03 LAB — LIPASE, BLOOD: Lipase: 15 U/L (ref 11–51)

## 2024-05-03 LAB — TSH: TSH: 2.46 u[IU]/mL (ref 0.35–5.50)

## 2024-05-03 MED ORDER — OXYCODONE HCL 5 MG PO TABS: 5.0000 mg | ORAL_TABLET | Freq: Three times a day (TID) | ORAL | 0 refills | Status: DC | PRN

## 2024-05-03 MED ORDER — CEFUROXIME AXETIL 500 MG PO TABS: 500.0000 mg | ORAL_TABLET | Freq: Two times a day (BID) | ORAL | 0 refills | Status: DC

## 2024-05-03 MED ORDER — CEFUROXIME AXETIL 500 MG PO TABS
500.0000 mg | ORAL_TABLET | Freq: Once | ORAL | Status: AC
  Administered 2024-05-03: 500 mg via ORAL
  Filled 2024-05-03 (×2): qty 1

## 2024-05-03 MED ORDER — OXYCODONE HCL 5 MG PO TABS: 5.0000 mg | ORAL_TABLET | Freq: Once | ORAL | Status: DC

## 2024-05-03 MED ORDER — IOHEXOL 350 MG/ML SOLN
100.0000 mL | Freq: Once | INTRAVENOUS | Status: AC | PRN
  Administered 2024-05-03: 100 mL via INTRAVENOUS

## 2024-05-03 MED ORDER — ONDANSETRON 4 MG PO TBDP
4.0000 mg | ORAL_TABLET | Freq: Once | ORAL | Status: DC
Start: 2024-05-03 — End: 2024-05-04

## 2024-05-03 MED ORDER — ONDANSETRON 4 MG PO TBDP: 4.0000 mg | ORAL_TABLET | Freq: Three times a day (TID) | ORAL | 0 refills | Status: DC | PRN

## 2024-05-03 NOTE — ED Triage Notes (Signed)
 Arrived by [facility] from home. C/o epigastric pain X1 week. Seen at PCP yesterday. Family reports fatigue today.  Able to stand and pivot X2 assist  EMS vitals: 149/81 b/p 135CBG 86HR 98.5oral 26RR 96% RA

## 2024-05-03 NOTE — ED Notes (Signed)
 Hold urine orders as well as Roxicodone  and Zofran , patient will get a RX to take at home per Dr Fernand

## 2024-05-03 NOTE — Discharge Instructions (Addendum)
 You were seen today due to concern of abdominal pain.  At this time it seems you likely have an infection of your gallbladder, I have written for some antibiotics retake as well as some pain medication and nausea medication.  Please take this as instructed.  If you decide to go back to have your gallbladder taken out, please present to a tertiary care center, either [place], [place], [facility], or [facility].  If you do notice worsening of symptoms such as nausea, uncontrolled pain, or any other symptoms you find concerning please return to the emergency department immediately for further medical management.

## 2024-05-03 NOTE — ED Notes (Signed)
 Pt back from CT

## 2024-05-03 NOTE — ED Notes (Signed)
 US  at bedside.

## 2024-05-03 NOTE — ED Provider Notes (Signed)
 [facility] Provider Note    Event Date/Time   First MD Initiated Contact with Patient 05/03/24 1605     (approximate)   History   Abdominal Pain   HPI  Ann [name] is a 89 y.o. female who presents with abdominal pain.  She does have some history of valvular disease and underlying A-fib.  Apparently over the last month she has been experiencing some generalized abdominal pain seemingly locating to the right upper quadrant but also generalized in nature, generally occurring after meals.  Patient has history of dementia and poor historian unfortunately.  Went to her primary doctor's office yesterday and abdominal exam was benign so patient was sent home.  Given the persistence of symptoms family had the patient brought to the emergency department for further assessment and evaluation.  Currently family informed me that patient feels well.  She does not have any smoking or alcohol use history, no prior surgical history.  She does take Eliquis  for her A-fib and has been compliant.     Physical Exam   Triage Vital Signs: ED Triage Vitals  Encounter Vitals Group     BP 05/03/24 1508 (!) 141/80     Girls Systolic BP Percentile --      Girls Diastolic BP Percentile --      Boys Systolic BP Percentile --      Boys Diastolic BP Percentile --      Pulse Rate 05/03/24 1508 87     Resp 05/03/24 1508 20     Temp 05/03/24 1508 98.1 F (36.7 C)     Temp Source 05/03/24 1508 Oral     SpO2 05/03/24 1508 95 %     Weight 05/03/24 1505 150 lb (68 kg)     Height 05/03/24 1505 5' 7 (1.702 m)     Head Circumference --      Peak Flow --      Pain Score 05/03/24 1505 1     Pain Loc --      Pain Education --      Exclude from Growth Chart --     Most recent vital signs: Vitals:   05/03/24 2200 05/03/24 2251  BP: (!) 154/92   Pulse: 91 88  Resp:  (!) 22  Temp: 98.3 F (36.8 C) 98.3 F (36.8 C)  SpO2: 96% 97%     General: Awake, no distress.  CV:  Good  peripheral perfusion.  Resp:  Normal effort.  Abd:  No distention.  Soft, generalized discomfort to palpation greatest in the right upper quadrant Other:     ED Results / Procedures / Treatments   Labs (all labs ordered are listed, but only abnormal results are displayed) Labs Reviewed  COMPREHENSIVE METABOLIC PANEL WITH GFR - Abnormal; Notable for the following components:      Result Value   Glucose, Bld 143 (*)    BUN 25 (*)    Total Bilirubin 1.9 (*)    GFR, Estimated 59 (*)    All other components within normal limits  LIPASE, BLOOD  CBC  URINALYSIS, ROUTINE W REFLEX MICROSCOPIC     EKG  Irregularly irregular rhythm with rate of about 100, axis of 15, intervals appear to be within normal limits, no obvious ischemia that I appreciate on this EKG   RADIOLOGY   PROCEDURES:  Critical Care performed: Yes, see critical care procedure note(s)  .Critical Care  Performed by: Fernand Rossie HERO, MD Authorized by: Fernand Rossie HERO, MD  Critical care provider statement:    Critical care time (minutes):  30   Critical care was time spent personally by me on the following activities:  Development of treatment plan with patient or surrogate, discussions with consultants, evaluation of patient's response to treatment, examination of patient, ordering and review of laboratory studies, ordering and review of radiographic studies, ordering and performing treatments and interventions, pulse oximetry, re-evaluation of patient's condition and review of old charts    MEDICATIONS ORDERED IN ED: Medications  oxyCODONE  (Oxy IR/ROXICODONE ) immediate release tablet 5 mg (has no administration in time range)  ondansetron  (ZOFRAN -ODT) disintegrating tablet 4 mg (has no administration in time range)  iohexol  (OMNIPAQUE ) 350 MG/ML injection 100 mL (100 mLs Intravenous Contrast Given 05/03/24 1705)  cefUROXime  (CEFTIN ) tablet 500 mg (500 mg Oral Given 05/03/24 2146)     IMPRESSION / MDM /  ASSESSMENT AND PLAN / ED COURSE  I reviewed the triage vital signs and the nursing notes.                               Patient's presentation is most consistent with acute complicated illness / injury requiring diagnostic workup.  89 year old female who presents today with concern of abdominal pain.  Seems to be postprandial in nature, generalized but greatest in the right upper quadrant and epigastrium.  Clinically at rest she appears well she is not in any acute distress, she is slightly hypertensive here but remaining vitals appear reassuring.  At rest her exam is fairly reassuring.  Given her presentation, considering possible biliary colic as the patient does have slight elevated bilirubin but this has been present before in the past as well, but also considering possibly mesenteric ischemia especially given her age or history of A-fib and other medical comorbidities.  Ongoing to obtain CT angio to assess further, and if this is negative consider possibly biliary ultrasound.  Discussed with family in regards to goals of care, seems that they would prefer to avoid surgical management if possible at this time.  Clinical Course as of 05/03/24 2329  Wed May 03, 2024  8167 CT imaging concerning for cholecystitis.  Will confirm with ultrasound imaging and discussed with family in regards to goals of care and likely anticipate discussion with surgery for further workup. [SK]  2033 I discussed findings of CT with Dr. Tye, he reviewed his CT imaging and recommended transfer to tertiary care center as the patient has multiple cyst on the liver which makes this a much more complicated surgery.  I discussed findings with the family and discussed optimal treatment and goals of care.  They have discussed with remaining family members, given tomorrow being Thanksgiving they are hoping to have the patient discharged home, and then reassess symptoms in the morning.  Will give the patient a course of antibiotics  as well as some pain medications and nausea medications.  I informed them that they should present to a tertiary care center if they would like to have surgery done.  I discussed return precautions as well, they verbalized understanding and are agreeable with the plan. [SK]    Clinical Course User Index [SK] Fernand Rossie HERO, MD     FINAL CLINICAL IMPRESSION(S) / ED DIAGNOSES   Final diagnoses:  Cholecystitis     Rx / DC Orders   ED Discharge Orders          Ordered    cefUROXime  (CEFTIN ) 500 MG tablet  2 times daily with meals        05/03/24 2040    oxyCODONE  (ROXICODONE ) 5 MG immediate release tablet  Every 8 hours PRN        05/03/24 2040    ondansetron  (ZOFRAN -ODT) 4 MG disintegrating tablet  Every 8 hours PRN        05/03/24 2040             Note:  This document was prepared using Dragon voice recognition software and may include unintentional dictation errors.   Fernand Rossie HERO, MD 05/03/24 2329

## 2024-05-07 ENCOUNTER — Encounter: Payer: Self-pay | Admitting: Internal Medicine

## 2024-05-07 ENCOUNTER — Ambulatory Visit: Payer: Self-pay | Admitting: Internal Medicine

## 2024-05-07 DIAGNOSIS — I110 Hypertensive heart disease with heart failure: Secondary | ICD-10-CM | POA: Insufficient documentation

## 2024-05-07 NOTE — Assessment & Plan Note (Signed)
 Check lipid panel

## 2024-05-07 NOTE — Assessment & Plan Note (Signed)
 Continue breztri. Breathing stable. No needing albuterol .

## 2024-05-07 NOTE — Assessment & Plan Note (Signed)
 Continues on cardizem ,imdur  and lasix . Blood pressure as outlined. No changes in medication today. Follow pressures. Check metabolic panel.

## 2024-05-07 NOTE — Assessment & Plan Note (Signed)
Continue thyroid replacement.  Follow tsh.   

## 2024-05-07 NOTE — Assessment & Plan Note (Signed)
 Rate controlled. Continue eliquis . Stable.

## 2024-05-07 NOTE — Assessment & Plan Note (Signed)
 Has had a history of elevated bilirubin. Remainder of liver panel wnl. No increased GI symptoms today. Recheck liver panel.

## 2024-05-07 NOTE — Assessment & Plan Note (Signed)
 Continue imdur , lasix . Following weights. No evidence of volume overload on exam today. Follow. Check metabolic panel.

## 2024-05-07 NOTE — Assessment & Plan Note (Signed)
 Avoid antiinflammatory medication. Check metabolic panel today.

## 2024-05-08 ENCOUNTER — Telehealth: Payer: Self-pay

## 2024-05-08 NOTE — Telephone Encounter (Signed)
 Please call and see if someone is with her now that can do a virtual visit.  I can see her at 1:30 virtual  - discuss hospice care.

## 2024-05-08 NOTE — Telephone Encounter (Signed)
 Pt passed on Friday night a little after 11pm at home per daughter [name])

## 2024-05-08 NOTE — Telephone Encounter (Signed)
 We received a fax from [facility] regarding patient having difficulty swallowing.  I sent a copy of documented phone call to Dr. Allena Scott's folder on the S drive.

## 2024-05-08 NOTE — Telephone Encounter (Signed)
 Called and spoke to Ms Ann [name]. Will let me know if needs anything.

## 2024-05-08 NOTE — ED Notes (Signed)
 Granddaughter called me--her mother is HCPOA and is on the phone.  Says they had not opted for hospice while she was here 11/26, but she is now declining and would like to see if we can order.  They have already spoken to hospice.  I explained that we are unable to order that on a discharged patient, but that pcp should be able to help with this.  She says she talked to a nurse from pcp, and they told her the office is closed and did not say anyone was on call.  I called the pcp and spoke wit;h the rep this am--explained the situation where we have an almost 89 year old who is declining and family requests hospice.  Did not hear back.  I called again and gave the info again.  Then the center rep called me back.  She states that she did contact the provider on call and was told they would take care of it on Monday.  I called Rondalyn (granddaughter) to explain that I could not get pcp to help until Monday.  I asked about her condition.  She has a bsc and is able to get up.  She is eating very little.  Not sure about discomfort, but she says she was moaning earlier.  I advised that if she is getting worse--with breathing or pain/discomfort --that she is welcome to return to the ED at any time.

## 2024-05-08 DEATH — deceased

## 2024-05-10 ENCOUNTER — Telehealth: Payer: Self-pay | Admitting: *Deleted

## 2024-06-27 ENCOUNTER — Ambulatory Visit

## 2024-08-31 ENCOUNTER — Ambulatory Visit: Admitting: Internal Medicine
